# Patient Record
Sex: Female | Born: 1985 | Hispanic: Yes | Marital: Married | Smoking: Never smoker
Health system: Southern US, Community
[De-identification: ages and names within clinical notes are randomized; demographics above are authoritative.]

## PROBLEM LIST (undated history)

## (undated) ENCOUNTER — Inpatient Hospital Stay (HOSPITAL_COMMUNITY): Payer: Self-pay

## (undated) DIAGNOSIS — I10 Essential (primary) hypertension: Secondary | ICD-10-CM

## (undated) DIAGNOSIS — A749 Chlamydial infection, unspecified: Secondary | ICD-10-CM

## (undated) DIAGNOSIS — R87619 Unspecified abnormal cytological findings in specimens from cervix uteri: Secondary | ICD-10-CM

## (undated) DIAGNOSIS — IMO0002 Reserved for concepts with insufficient information to code with codable children: Secondary | ICD-10-CM

## (undated) DIAGNOSIS — O139 Gestational [pregnancy-induced] hypertension without significant proteinuria, unspecified trimester: Secondary | ICD-10-CM

## (undated) HISTORY — DX: Essential (primary) hypertension: I10

---
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
## 2005-01-25 ENCOUNTER — Ambulatory Visit: Payer: Self-pay | Admitting: Internal Medicine

## 2009-12-12 DIAGNOSIS — A749 Chlamydial infection, unspecified: Secondary | ICD-10-CM

## 2009-12-12 HISTORY — DX: Chlamydial infection, unspecified: A74.9

## 2013-04-16 ENCOUNTER — Emergency Department (HOSPITAL_COMMUNITY)
Admission: EM | Admit: 2013-04-16 | Discharge: 2013-04-16 | Disposition: A | Discharge status: Home / Self Care | Payer: Self-pay | Attending: Emergency Medicine | Admitting: Emergency Medicine

## 2013-04-16 ENCOUNTER — Emergency Department (HOSPITAL_COMMUNITY): Payer: Self-pay

## 2013-04-16 ENCOUNTER — Encounter (HOSPITAL_COMMUNITY): Payer: Self-pay

## 2013-04-16 DIAGNOSIS — O208 Other hemorrhage in early pregnancy: Secondary | ICD-10-CM | POA: Insufficient documentation

## 2013-04-16 DIAGNOSIS — O209 Hemorrhage in early pregnancy, unspecified: Secondary | ICD-10-CM

## 2013-04-16 LAB — BASIC METABOLIC PANEL
BUN: 8 mg/dL (ref 6–23)
GFR calc Af Amer: >90 mL/min (ref >90)
GFR calc non Af Amer: >90 mL/min (ref >90)
Potassium: 4.1 mEq/L (ref 3.5–5.1)
Sodium: 135 mEq/L (ref 135–145)

## 2013-04-16 LAB — CBC WITH DIFFERENTIAL/PLATELET
Basophils Absolute: 0 10*3/uL (ref 0.0–0.1)
Basophils Relative: 0 % (ref 0–1)
Eosinophils Absolute: 0.2 10*3/uL (ref 0.0–0.7)
MCH: 30.3 pg (ref 26.0–34.0)
MCHC: 35.3 g/dL (ref 30.0–36.0)
Neutrophils Relative %: 64 % (ref 43–77)
Platelets: 279 10*3/uL (ref 150–400)
RDW: 13.2 % (ref 11.5–15.5)

## 2013-04-16 LAB — ABO/RH: ABO/RH(D): O POS

## 2013-04-16 LAB — WET PREP, GENITAL
Clue Cells Wet Prep HPF POC: NONE SEEN
Trich, Wet Prep: NONE SEEN
Yeast Wet Prep HPF POC: NONE SEEN

## 2013-04-16 LAB — HCG, QUANTITATIVE, PREGNANCY: hCG, Beta Chain, Quant, S: 38081 m[IU]/mL — ABNORMAL HIGH (ref <5)

## 2013-04-16 MED ORDER — PRENATAL COMPLETE 14-0.4 MG PO TABS: 1.0000 | ORAL_TABLET | Freq: Every day | ORAL | Status: DC

## 2013-04-16 NOTE — ED Notes (Signed)
Was told 2 weeks ago at the [facility] that she is approximately [redacted] weeks pregnant.  She denies any pain or discomfort but mild spotting this am.  She denies any n/v . She reports that the spotting has stopped

## 2013-04-16 NOTE — ED Provider Notes (Signed)
History     CSN: [id number]  Arrival date & time 04/16/13  0715   First MD Initiated Contact with Patient 04/16/13 [phone]      Chief Complaint  Patient presents with  . Vaginal Bleeding    (Consider location/radiation/quality/duration/timing/severity/associated sxs/prior treatment) HPI Comments: Patient is a 27 year old G4P3 female who presents with 1 episode of vaginal bleeding this morning. Patient reports having some spotting when she went to the bathroom this morning. She has not had an episode since then. She reports being about [redacted] weeks pregnant. She denies any other symptoms. No aggravating/alleviating factors. Patient denies any complications with previous pregnancies.   Patient is a 27 y.o. female presenting with vaginal bleeding.  Vaginal Bleeding    History reviewed. No pertinent past medical history.  History reviewed. No pertinent past surgical history.  No family history on file.  History  Substance Use Topics  . Smoking status: Never Smoker   . Smokeless tobacco: Not on file  . Alcohol Use: No    OB History   Grav Para Term Preterm Abortions TAB SAB Ect Mult Living   1               Review of Systems  Genitourinary: Positive for vaginal bleeding.  All other systems reviewed and are negative.    Allergies  Penicillins  Home Medications   Current Outpatient Rx  Name  Route  Sig  Dispense  Refill  . Prenatal Vit-Fe Fumarate-FA (PRENATAL MULTIVITAMIN) TABS   Oral   Take 1 tablet by mouth daily at 12 noon.           BP 129/83  Pulse 79  Temp(Src) 98.3 F (36.8 C) (Oral)  Resp 18  SpO2 100%  LMP 02/27/2013  Physical Exam  Nursing note and vitals reviewed. Constitutional: She appears well-developed and well-nourished. No distress.  HENT:  Head: Normocephalic and atraumatic.  Eyes: Conjunctivae are normal.  Neck: Normal range of motion.  Cardiovascular: Normal rate and regular rhythm.  Exam reveals no gallop and no friction rub.   No  murmur heard. Pulmonary/Chest: Effort normal and breath sounds normal. She has no wheezes. She has no rales. She exhibits no tenderness.  Abdominal: Soft. She exhibits no distension. There is no tenderness. There is no rebound and no guarding.  Genitourinary:  Cervical os close. No CMT. No adnexal tenderness to palpation or abnormal masses palpated.  Musculoskeletal: Normal range of motion.  Neurological: She is alert.  Speech is goal-oriented. Moves limbs without ataxia.   Skin: Skin is warm and dry.  Psychiatric: She has a normal mood and affect. Her behavior is normal.    ED Course  Procedures (including critical care time)  Labs Reviewed  WET PREP, GENITAL - Abnormal; Notable for the following:    WBC, Wet Prep HPF POC FEW (*)    All other components within normal limits  CBC WITH DIFFERENTIAL - Abnormal; Notable for the following:    HCT 34.0 (*)    All other components within normal limits  HCG, QUANTITATIVE, PREGNANCY - Abnormal; Notable for the following:    hCG, Beta Chain, Quant, [place] [address] (*)    All other components within normal limits  GC/CHLAMYDIA PROBE AMP  BASIC METABOLIC PANEL  ABO/RH   US Ob Comp Less 14 Wks  04/16/2013  *RADIOLOGY REPORT*  Clinical Data: 27 year old pregnant female with pelvic pain and vaginal bleeding.  Estimated gestational age of [redacted] weeks 4 days by LMP.  OBSTETRIC <14 WK  [place] AND TRANSVAGINAL OB US  Technique:  Both transabdominal and transvaginal ultrasound examinations were performed for complete evaluation of the gestation as well as the maternal uterus, adnexal regions, and pelvic cul-de-sac.  Transvaginal technique was performed to assess early pregnancy.  Comparison:  None  Intrauterine gestational sac:  Visualized/normal in shape. Yolk sac: Visualized Embryo: Visualized Cardiac Activity: Present Heart Rate: 133 bpm  CRL: 5.2  mm  6 w  2 d        [place] EDC: 12/08/2013  Maternal uterus/adnexae: A small subchorionic hemorrhage is identified. A 7 x 10 mm  hypoechoic area in the lower uterine body may represent a fibroid. The ovaries bilaterally are unremarkable.  There is a trace amount of free pelvic fluid.  There is no evidence of solid adnexal mass.  IMPRESSION: Single living intrauterine gestation with estimated gestational age of [redacted] weeks 2 days by this ultrasound.  Small subchorionic hemorrhage.  Question 7 x 10 mm lower uterine body fibroid.   Original Report Authenticated By: Harmon Pier, M.D.    US Ob Transvaginal  04/16/2013  *RADIOLOGY REPORT*  Clinical Data: 27 year old pregnant female with pelvic pain and vaginal bleeding.  Estimated gestational age of [redacted] weeks 4 days by LMP.  OBSTETRIC <14 WK [place] AND TRANSVAGINAL OB US  Technique:  Both transabdominal and transvaginal ultrasound examinations were performed for complete evaluation of the gestation as well as the maternal uterus, adnexal regions, and pelvic cul-de-sac.  Transvaginal technique was performed to assess early pregnancy.  Comparison:  None  Intrauterine gestational sac:  Visualized/normal in shape. Yolk sac: Visualized Embryo: Visualized Cardiac Activity: Present Heart Rate: 133 bpm  CRL: 5.2  mm  6 w  2 d        [place] EDC: 12/08/2013  Maternal uterus/adnexae: A small subchorionic hemorrhage is identified. A 7 x 10 mm hypoechoic area in the lower uterine body may represent a fibroid. The ovaries bilaterally are unremarkable.  There is a trace amount of free pelvic fluid.  There is no evidence of solid adnexal mass.  IMPRESSION: Single living intrauterine gestation with estimated gestational age of [redacted] weeks 2 days by this ultrasound.  Small subchorionic hemorrhage.  Question 7 x 10 mm lower uterine body fibroid.   Original Report Authenticated By: Harmon Pier, M.D.      1. Vaginal bleeding in pregnancy, first trimester       MDM  8:51 AM Labs, wet prep, and ultrasound pending. Patient afebrile with stable vitals at this time.   12:04 PM Labs, wet prep and US unremarkable for acute  changes. Patient is O positive blood type so she will not need Rhogam. Patient will be discharged with instructions to follow up with her OBGYN. I will discharge the patient with prenatal vitamins and instructions to follow up at [facility] with worsening or concerning symptoms.      Emilia Beck, PA-C 04/16/13 1500

## 2013-04-16 NOTE — ED Notes (Signed)
Pt. Has had no active bleeding since arrival.  Pt. Denies any pain or discomfort.   Explained to pt. That we are waiting for lab results.    Water given to pt.

## 2013-04-16 NOTE — ED Provider Notes (Signed)
Medical screening examination/treatment/procedure(s) were performed by non-physician practitioner and as supervising physician I was immediately available for consultation/collaboration.   Loren Racer, MD 04/16/13 1515

## 2013-04-17 LAB — GC/CHLAMYDIA PROBE AMP
CT Probe RNA: NEGATIVE
GC Probe RNA: NEGATIVE

## 2013-04-29 LAB — OB RESULTS CONSOLE RPR: RPR: NONREACTIVE

## 2013-04-29 LAB — OB RESULTS CONSOLE GC/CHLAMYDIA
Chlamydia: NEGATIVE
Gonorrhea: NEGATIVE

## 2013-04-29 LAB — OB RESULTS CONSOLE HIV ANTIBODY (ROUTINE TESTING): HIV: NONREACTIVE

## 2013-04-29 LAB — OB RESULTS CONSOLE HEPATITIS B SURFACE ANTIGEN: Hepatitis B Surface Ag: NEGATIVE

## 2013-04-29 LAB — OB RESULTS CONSOLE RUBELLA ANTIBODY, IGM: Rubella: IMMUNE

## 2013-09-11 DIAGNOSIS — O139 Gestational [pregnancy-induced] hypertension without significant proteinuria, unspecified trimester: Secondary | ICD-10-CM

## 2013-09-11 DIAGNOSIS — I10 Essential (primary) hypertension: Secondary | ICD-10-CM

## 2013-09-11 HISTORY — DX: Gestational (pregnancy-induced) hypertension without significant proteinuria, unspecified trimester: O13.9

## 2013-09-11 HISTORY — DX: Essential (primary) hypertension: I10

## 2013-09-12 LAB — OB RESULTS CONSOLE RPR: RPR: NONREACTIVE

## 2013-10-21 ENCOUNTER — Inpatient Hospital Stay (HOSPITAL_COMMUNITY)
Admission: AD | Admit: 2013-10-21 | Discharge: 2013-10-27 | DRG: 765 | Disposition: A | Discharge status: Home / Self Care | Payer: Medicaid Other | Source: Ambulatory Visit | Attending: Obstetrics & Gynecology | Admitting: Obstetrics & Gynecology

## 2013-10-21 ENCOUNTER — Inpatient Hospital Stay (HOSPITAL_COMMUNITY): Payer: Medicaid Other

## 2013-10-21 ENCOUNTER — Encounter (HOSPITAL_COMMUNITY): Payer: Self-pay | Admitting: *Deleted

## 2013-10-21 DIAGNOSIS — O133 Gestational [pregnancy-induced] hypertension without significant proteinuria, third trimester: Secondary | ICD-10-CM

## 2013-10-21 DIAGNOSIS — O1413 Severe pre-eclampsia, third trimester: Secondary | ICD-10-CM

## 2013-10-21 DIAGNOSIS — O1414 Severe pre-eclampsia complicating childbirth: Principal | ICD-10-CM | POA: Diagnosis present

## 2013-10-21 DIAGNOSIS — O1410 Severe pre-eclampsia, unspecified trimester: Secondary | ICD-10-CM | POA: Diagnosis present

## 2013-10-21 DIAGNOSIS — O1490 Unspecified pre-eclampsia, unspecified trimester: Secondary | ICD-10-CM

## 2013-10-21 DIAGNOSIS — O4100X0 Oligohydramnios, unspecified trimester, not applicable or unspecified: Secondary | ICD-10-CM | POA: Diagnosis present

## 2013-10-21 HISTORY — DX: Reserved for concepts with insufficient information to code with codable children: IMO0002

## 2013-10-21 HISTORY — DX: Chlamydial infection, unspecified: A74.9

## 2013-10-21 HISTORY — DX: Unspecified abnormal cytological findings in specimens from cervix uteri: R87.619

## 2013-10-21 HISTORY — DX: Gestational (pregnancy-induced) hypertension without significant proteinuria, unspecified trimester: O13.9

## 2013-10-21 LAB — COMPREHENSIVE METABOLIC PANEL
Albumin: 2.5 g/dL — ABNORMAL LOW (ref 3.5–5.2)
Alkaline Phosphatase: 172 U/L — ABNORMAL HIGH (ref 39–117)
BUN: 8 mg/dL (ref 6–23)
Potassium: 4 mEq/L (ref 3.5–5.1)
Total Protein: 6.3 g/dL (ref 6.0–8.3)

## 2013-10-21 LAB — PROTEIN / CREATININE RATIO, URINE
Creatinine, Urine: 40.4 mg/dL
Total Protein, Urine: 59.3 mg/dL

## 2013-10-21 LAB — TYPE AND SCREEN: ABO/RH(D): O POS

## 2013-10-21 LAB — CBC
HCT: 34.6 % — ABNORMAL LOW (ref 36.0–46.0)
MCHC: 34.4 g/dL (ref 30.0–36.0)
RDW: 14.2 % (ref 11.5–15.5)

## 2013-10-21 MED ORDER — BETAMETHASONE SOD PHOS & ACET 6 (3-3) MG/ML IJ SUSP
12.0000 mg | INTRAMUSCULAR | Status: AC
  Administered 2013-10-22: 12 mg via INTRAMUSCULAR
  Filled 2013-10-21: qty 2

## 2013-10-21 MED ORDER — CALCIUM CARBONATE ANTACID 500 MG PO CHEW: 2.0000 | CHEWABLE_TABLET | ORAL | Status: DC | PRN

## 2013-10-21 MED ORDER — BETAMETHASONE SOD PHOS & ACET 6 (3-3) MG/ML IJ SUSP
12.0000 mg | Freq: Once | INTRAMUSCULAR | Status: AC
  Administered 2013-10-21: 12 mg via INTRAMUSCULAR
  Filled 2013-10-21: qty 2

## 2013-10-21 MED ORDER — ZOLPIDEM TARTRATE 5 MG PO TABS: 5.0000 mg | ORAL_TABLET | Freq: Every evening | ORAL | Status: DC | PRN

## 2013-10-21 MED ORDER — SODIUM CHLORIDE 0.9 % IJ SOLN
3.0000 mL | Freq: Two times a day (BID) | INTRAMUSCULAR | Status: DC
  Administered 2013-10-21 – 2013-10-22 (×2): 3 mL via INTRAVENOUS

## 2013-10-21 MED ORDER — PRENATAL MULTIVITAMIN CH
1.0000 | ORAL_TABLET | Freq: Every day | ORAL | Status: DC
  Administered 2013-10-22 – 2013-10-23 (×2): 1 via ORAL
  Filled 2013-10-21 (×2): qty 1

## 2013-10-21 MED ORDER — ACETAMINOPHEN 325 MG PO TABS
650.0000 mg | ORAL_TABLET | ORAL | Status: DC | PRN
  Administered 2013-10-21 – 2013-10-22 (×3): 650 mg via ORAL
  Filled 2013-10-21 (×3): qty 2

## 2013-10-21 MED ORDER — DOCUSATE SODIUM 100 MG PO CAPS: 100.0000 mg | ORAL_CAPSULE | Freq: Every day | ORAL | Status: DC | PRN

## 2013-10-21 NOTE — H&P (Signed)
FACULTY PRACTICE ANTEPARTUM ADMISSION HISTORY AND PHYSICAL  Chief Complaint:  Hypertension   Melissa Quinn is a 27 y.o.  [phone] with IUP at [redacted]w[redacted]d by LMP presenting for Hypertension  Pt states she was seen at health department on Friday at which time her blood pressure was elevated (140s systolic per pt report). She was instructed to complete a 24 hour urine study and follow up today. Today at the HD her BP remained elevated and she was sent to MAU for further evaluation. Her initial BP in MAU was 159/100 but has since trended down to 136/91. She denies headache, vision changes, RUQ pain and lower extremity swelling. She endorses mild swelling of her hands. She reports a normal prenatal course thus far and states she has never had problems with her BP in the past.   She reports occasional contractions (last felt some over the weekend). She denies LOF and VB. She reports normal FM.   Menstrual History: OB History   Grav Para Term Preterm Abortions TAB SAB Ect Mult Living   4 3 2 1      3       No LMP recorded.      Past Medical History  Diagnosis Date  . Pregnancy induced hypertension     Past Surgical History  Procedure Laterality Date  . No past surgeries      Family History  Problem Relation Age of Onset  . Diabetes Mother   . Hypertension Mother   . Hypertension Sister   . Asthma Son   . Diabetes Maternal Grandmother     History  Substance Use Topics  . Smoking status: Never Smoker   . Smokeless tobacco: Never Used  . Alcohol Use: No      Allergies  Allergen Reactions  . Penicillins Hives    Prescriptions prior to admission  Medication Sig Dispense Refill  . Prenatal Vit-Fe Fumarate-FA (PRENATAL COMPLETE) 14-0.4 MG TABS Take 1 tablet by mouth daily.  60 each  3  . Prenatal Vit-Fe Fumarate-FA (PRENATAL MULTIVITAMIN) TABS Take 1 tablet by mouth daily at 12 noon.        Review of Systems - Negative except for what is mentioned in HPI.  Physical Exam   Blood pressure 136/91, pulse 74, temperature 98.3 F (36.8 C), temperature source Oral, resp. rate 18, height 4\' 9"  (1.448 m), weight 66.951 kg (147 lb 9.6 oz), unknown if currently breastfeeding. GENERAL: Well-developed, well-nourished female in no acute distress.  LUNGS: Clear to auscultation bilaterally.  HEART: Regular rate and rhythm. ABDOMEN: Soft, nontender, nondistended, gravid.  EXTREMITIES: Nontender, no edema, 2+ distal pulses. Presentation: cephalic by ultrasound FHT:  Baseline rate 145 bpm   Variability moderate  Accelerations present   Decelerations none Contractions: Every 5 mins   Labs: Results for orders placed during the hospital encounter of 10/21/13 (from the past 24 hour(s))  PROTEIN / CREATININE RATIO, URINE   Collection Time    10/21/13 10:40 AM      Result Value Range   Creatinine, Urine 40.40     Total Protein, Urine 59.3     PROTEIN CREATININE RATIO 1.47 (*) 0.00 - 0.15  CBC   Collection Time    10/21/13 10:53 AM      Result Value Range   WBC 7.3  4.0 - 10.5 K/uL   RBC 3.93  3.87 - 5.11 MIL/uL   Hemoglobin 11.9 (*) 12.0 - 15.0 g/dL   HCT 95.6 (*) 21.3 - 08.6 %   MCV  88.0  78.0 - 100.0 fL   MCH 30.3  26.0 - 34.0 pg   MCHC 34.4  30.0 - 36.0 g/dL   RDW 16.1  09.6 - 04.5 %   Platelets 208  150 - 400 K/uL  COMPREHENSIVE METABOLIC PANEL   Collection Time    10/21/13 10:53 AM      Result Value Range   Sodium 136  135 - 145 mEq/L   Potassium 4.0  3.5 - 5.1 mEq/L   Chloride 103  96 - 112 mEq/L   CO2 23  19 - 32 mEq/L   Glucose, Bld 78  70 - 99 mg/dL   BUN 8  6 - 23 mg/dL   Creatinine, Ser 4.09  0.50 - 1.10 mg/dL   Calcium 9.3  8.4 - 81.1 mg/dL   Total Protein 6.3  6.0 - 8.3 g/dL   Albumin 2.5 (*) 3.5 - 5.2 g/dL   AST 21  0 - 37 U/L   ALT 15  0 - 35 U/L   Alkaline Phosphatase 172 (*) 39 - 117 U/L   Total Bilirubin 0.2 (*) 0.3 - 1.2 mg/dL   GFR calc non Af Amer >90  >90 mL/min   GFR calc Af Amer >90  >90 mL/min    Imaging Studies:  [place] BPP  8/8 AFI 15  Assessment: Melissa Quinn is  27 y.o. [id number] at [redacted]w[redacted]d by LMP presents with Hypertension. BP initially 140s-150s/79-100 however have now improved to 120s-130s/70s-90s. She is asymptomatic. CBC and CMP are unremarkable. BPP 8/8. AFI 15. Urine protein creatinine ratio elevated at 1.47 consistent with diagnosis of Preeclampsia without severe features.   Plan: 1. Admit to antepartum service for observation and BP monitoring.  2. She is s/p Betamethasone #1 today. Will give 2nd dose tomorrow at 1500.  3. FWB: Cat I. NST reactive. BPP 8/8.    Cowart, Ryann 11/10/201411:11 AM  I have seen and examined this patient and agree with above documentation in the resident's note. Pt with elevated pressures and with prot/cr ratio consistent with Pre-eclampsia. No severe features on labs or symptoms.  Admit for BMZ #2 tomorrow at 1500. Will then monitor overnight and ensure BP remains stable.  Plan discussed with Dr. Marice Potter.    Rulon Abide, M.D. [facility] Fellow 10/21/2013 4:50 PM

## 2013-10-21 NOTE — MAU Note (Signed)
FHR 155; NST reactive; EFM d/c'ed per provider after reviewing tracing; awaiting U/S.

## 2013-10-21 NOTE — MAU Note (Signed)
Pt sent from [facility], BP was high last week, is higher today, brought in 24 hour urine specimen.  Denies pain, LOF or bleeding.

## 2013-10-21 NOTE — MAU Note (Signed)
Patient presents with complaint that she went to [facility] Dept where her BP was elevated and has been since Friday so she was instructed to come to MAU. Patient brought a 24 hour specimen with her that she was instructed to start on Sunday and completed with morning. Specimen placed on ice.

## 2013-10-22 ENCOUNTER — Observation Stay (HOSPITAL_COMMUNITY): Payer: Medicaid Other

## 2013-10-22 ENCOUNTER — Encounter (HOSPITAL_COMMUNITY): Payer: Medicaid Other

## 2013-10-22 DIAGNOSIS — O1410 Severe pre-eclampsia, unspecified trimester: Secondary | ICD-10-CM | POA: Diagnosis present

## 2013-10-22 DIAGNOSIS — O139 Gestational [pregnancy-induced] hypertension without significant proteinuria, unspecified trimester: Secondary | ICD-10-CM

## 2013-10-22 LAB — COMPREHENSIVE METABOLIC PANEL
ALT: 16 U/L (ref 0–35)
Albumin: 2.5 g/dL — ABNORMAL LOW (ref 3.5–5.2)
Alkaline Phosphatase: 178 U/L — ABNORMAL HIGH (ref 39–117)
BUN: 11 mg/dL (ref 6–23)
CO2: 22 mEq/L (ref 19–32)
Chloride: 103 mEq/L (ref 96–112)
GFR calc non Af Amer: >90 mL/min (ref >90)
Glucose, Bld: 102 mg/dL — ABNORMAL HIGH (ref 70–99)
Potassium: 4.3 mEq/L (ref 3.5–5.1)
Sodium: 137 mEq/L (ref 135–145)
Total Bilirubin: 0.2 mg/dL — ABNORMAL LOW (ref 0.3–1.2)

## 2013-10-22 LAB — ABO/RH: ABO/RH(D): O POS

## 2013-10-22 LAB — CBC
HCT: 35.7 % — ABNORMAL LOW (ref 36.0–46.0)
Hemoglobin: 12.2 g/dL (ref 12.0–15.0)
MCV: 88.6 fL (ref 78.0–100.0)
RBC: 4.03 MIL/uL (ref 3.87–5.11)
RDW: 14.3 % (ref 11.5–15.5)
WBC: 10.4 10*3/uL (ref 4.0–10.5)

## 2013-10-22 LAB — PROTEIN, URINE, 24 HOUR
Collection Interval-UPROT: 24 hours
Urine Total Volume-UPROT: 1020 mL

## 2013-10-22 LAB — OB RESULTS CONSOLE GBS: GBS: NEGATIVE

## 2013-10-22 MED ORDER — MAGNESIUM SULFATE 40 G IN LACTATED RINGERS - SIMPLE
2.0000 g/h | INTRAVENOUS | Status: DC
  Administered 2013-10-22 – 2013-10-23 (×2): 2 g/h via INTRAVENOUS
  Filled 2013-10-22 (×2): qty 500

## 2013-10-22 MED ORDER — OXYCODONE-ACETAMINOPHEN 5-325 MG PO TABS
1.0000 | ORAL_TABLET | Freq: Four times a day (QID) | ORAL | Status: DC | PRN
  Administered 2013-10-22: 2 via ORAL
  Filled 2013-10-22: qty 2

## 2013-10-22 MED ORDER — FENTANYL CITRATE 0.05 MG/ML IJ SOLN
50.0000 ug | INTRAMUSCULAR | Status: DC | PRN
  Administered 2013-10-22 – 2013-10-23 (×6): 50 ug via INTRAVENOUS
  Administered 2013-10-23: 100 ug via INTRAVENOUS
  Filled 2013-10-22 (×8): qty 2

## 2013-10-22 MED ORDER — LACTATED RINGERS IV SOLN
INTRAVENOUS | Status: DC
  Administered 2013-10-22 – 2013-10-23 (×4): via INTRAVENOUS

## 2013-10-22 MED ORDER — MAGNESIUM SULFATE BOLUS VIA INFUSION
4.0000 g | Freq: Once | INTRAVENOUS | Status: AC
  Administered 2013-10-22: 4 g via INTRAVENOUS
  Filled 2013-10-22: qty 500

## 2013-10-22 MED ORDER — HYDRALAZINE HCL 20 MG/ML IJ SOLN
10.0000 mg | INTRAMUSCULAR | Status: DC | PRN
  Administered 2013-10-23: 10 mg via INTRAVENOUS
  Filled 2013-10-22: qty 1

## 2013-10-22 NOTE — Consult Note (Signed)
Maternal Fetal Medicine Consultation  Requesting Provider(s): Jaynie Collins, MD  Reason for consultation: Preeclampsia  HPI: Melissa Quinn is a 27 yo [id number] currently at 23 2/7 weeks - admitted yesterday due to preeclampsia.  The patient has had labile blood pressures - mostly in the 140-150/90 range.  Recent 24-hr urine showed 622 mg /24 hr protein.  The remainder of the patient's preeclampsia labs have been within normal limits. The patient reports a frontal headache - was severe earlier in the day and resolved.  She reports that it recently returned (currently 2-3/10).  She is currently on Magnesium sulfate prophylaxis.  Her prenatal course has otherwise been uncomplicated.  She reports having normal blood pressures earlier in pregnancy and denies a history of chronic hypertension.  OB History: OB History   Grav Para Term Preterm Abortions TAB SAB Ect Mult Living   5 3 2 1      3     G1 - age 23, 55 week PROM, SVD G27 - age 27, term SVD without complications G15 - age 13, term SVD without complications G4 - current (different father)  PMH:  Past Medical History  Diagnosis Date  . Pregnancy induced hypertension     PSH:  Past Surgical History  Procedure Laterality Date  . No past surgeries     Meds:  Scheduled Meds: . prenatal multivitamin  1 tablet Oral Q1200  . sodium chloride  3 mL Intravenous Q12H   Continuous Infusions: . lactated ringers 100 mL/hr at 10/22/13 1113  . magnesium sulfate 2 g/hr (10/22/13 1134)   PRN Meds:.acetaminophen, calcium carbonate, docusate sodium, oxyCODONE-acetaminophen, zolpidem  Allergies:  Allergies  Allergen Reactions  . Penicillins Hives   FH: denies family history of birth defects or hereditary disorders  Soc: denies ETOH use of tobacco use during pregnancy  Review of Systems: no vaginal bleeding or cramping/contractions, no LOF, no nausea/vomiting. All other systems reviewed and are negative. PE:   Filed Vitals:   10/22/13 1504  BP: 136/88  Pulse: 89  Temp:   Resp: 18    GEN: well-appearing female ABD: gravid, NT Ext: 1+ edema, 2+ DTRs with single beat of clonus  Ultrasound: Single IUP at 32 2/7 weeks Preeclampsia Fetal growth is appropriate (55th %tile) Somewhat limited views of the fetal anatomy obtained due to late gestational age No gross anomalies noted Oligohydramnios noted (AFI 4.6 cm)  Labs: CBC    Component Value Date/Time   WBC 10.4 10/22/2013 0525   RBC 4.03 10/22/2013 0525   HGB 12.2 10/22/2013 0525   HCT 35.7* 10/22/2013 0525   PLT 228 10/22/2013 0525   MCV 88.6 10/22/2013 0525   MCH 30.3 10/22/2013 0525   MCHC 34.2 10/22/2013 0525   RDW 14.3 10/22/2013 0525   LYMPHSABS 2.5 04/16/2013 0812   MONOABS 0.6 04/16/2013 0812   EOSABS 0.2 04/16/2013 0812   BASOSABS 0.0 04/16/2013 0812   CMP     Component Value Date/Time   NA 137 10/22/2013 0525   K 4.3 10/22/2013 0525   CL 103 10/22/2013 0525   CO2 22 10/22/2013 0525   GLUCOSE 102* 10/22/2013 0525   BUN 11 10/22/2013 0525   CREATININE 0.61 10/22/2013 0525   CALCIUM 9.3 10/22/2013 0525   PROT 6.3 10/22/2013 0525   ALBUMIN 2.5* 10/22/2013 0525   AST 21 10/22/2013 0525   ALT 16 10/22/2013 0525   ALKPHOS 178* 10/22/2013 0525   BILITOT 0.2* 10/22/2013 0525   GFRNONAA >90 10/22/2013 0525   GFRAA >90  10/22/2013 0525   Protein/Creat = 1.47  24 hr urine protein - 622 mg/24 hrs  A/P: 1) Single IUP at 32 2/7 weeks         2) Preeclampsia - if the patient continues to have frontal headaches that do not resolve with treatment (may need to try a single course of narcotics), would recommend delivery (criteria for preeclampsia with severe features).  Recommend continuing Magnesium sulfate prophylaxis for the next 12-24 hours.  If possible would try to achieve another 12-24 hours to maximize benefit from course of betamethasone.  If the headaches resolves, she may be a candidate for close outpatient monitoring.  At present, this is  the only criteria that puts her in the severe category.  If this were to occur, would recommend 2x weekly NSTs with at least weekly AFIs and serial growth scans every 3-4 weeks and weekly preeclampsia labs.  Would move toward earlier delivery is she meets any other criteria for preeclampsia with severe features (LFTs > 2x normal, Creat > 1.1, Plts < 100K, etc.)         3) Oligohydramnios - if persistent, would recommend delivery at 36-37 weeks.  Findings and recommendations discussed with Dr. Macon Large  Thank you for the opportunity to be a part of the care of Melissa Quinn. Please contact our office if we can be of further assistance.   I spent approximately 30 minutes with this patient with over 50% of time spent in face-to-face counseling.  Alpha Gula, MD Maternal Fetal Medicine

## 2013-10-22 NOTE — Consult Note (Signed)
Asked by Dr Macon Large to speak to Melissa Quinn to discuss outcome of preterm infants. I reviewed her history. Her first pregnancy was a [redacted] wk gestation with 2 term babies that followed. Briefly, she is currently at 33 2/7 weeks, pregnancy was uncomplicated until recently with onset of preeclampsia and oligohydramnios. The baby is appropriately grown, EFW 2130 gms, 55%. Prenatal labs are neg with unknown GBS status. She has received her 2nd dose of betamethasone today and is now on magnesium sulfate for preeclampsia.    I spoke to Melissa Quinn with her partner present. She delivered her 32 week preterm here and that baby stayed with [place] in our NICU. He is now 27 y.o, is healthy and doing well in school. I reminded them of the common problems encountered at this gestation such as lung immaturity needing resp support. I discussed IVF, gavage feeding, temp support, and estimated LOS. Melissa Quinn does not appear worried as her previous baby has done well. She plans to breast feed as she did the other child.   Thank you for this consult.  I spent 27 min with this consult, over 50% of the time was spent  with face to face counseling.  Lucillie Garfinkel, MD Neonatologist

## 2013-10-22 NOTE — Progress Notes (Signed)
UR completed 

## 2013-10-22 NOTE — Progress Notes (Signed)
FACULTY PRACTICE ANTEPARTUM COMPREHENSIVE PROGRESS NOTE  Melissa Quinn is a 27 y.o. [phone] at [redacted]w[redacted]d  who is admitted for evalaution of preeclampsia.  Estimated Date of Delivery: 12/08/13 Fetal presentation is cephalic.  Length of Stay:  1 Days. 10/21/2013  Subjective: Patient reports severe frontal headache and described seeing "black spots" when she watches TV. Also reported mild epigastric pain earlier in the day, resolved with eating.   Patient reports good fetal movement.  She reports no uterine contractions, no bleeding and no loss of fluid per vagina.  Vitals:  Blood pressure 146/88, pulse 100, temperature 98.4 F (36.9 C), temperature source Oral, resp. rate 18, height 4\' 9"  (1.448 m), weight 147 lb 9.6 oz (66.951 kg), last menstrual period 02/27/2013, unknown if currently breastfeeding. Physical Examination: General - alert, oriented to person, place, and time Chest - clear to auscultation, no wheezes, rales or rhonchi, symmetric air entry Heart - normal rate, regular rhythm, normal S1, S2, no murmurs, rubs, clicks or gallops, normal rate and regular rhythm Abdomen - soft, nontender, nondistended, gravid, no masses or organomegaly Cervical Exam: not evaluated Extremities: extremities normal, atraumatic, no cyanosis or edema and Homans sign is negative, no sign of DVT with DTRs 3+ bilaterally with 1 beat of clonus Membranes:intact  Fetal Monitoring:  Baseline: 150 bpm, Variability: moderate, Accelerations: Reactive and Decelerations: Absent  Results for orders placed during the hospital encounter of 10/21/13 (from the past 24 hour(s))  CBC   Collection Time    10/21/13 10:53 AM      Result Value Range   WBC 7.3  4.0 - 10.5 K/uL   RBC 3.93  3.87 - 5.11 MIL/uL   Hemoglobin 11.9 (*) 12.0 - 15.0 g/dL   HCT 45.4 (*) 09.8 - 11.9 %   MCV 88.0  78.0 - 100.0 fL   MCH 30.3  26.0 - 34.0 pg   MCHC 34.4  30.0 - 36.0 g/dL   RDW 14.7  82.9 - 56.2 %   Platelets 208  150 - 400 K/uL   COMPREHENSIVE METABOLIC PANEL   Collection Time    10/21/13 10:53 AM      Result Value Range   Sodium 136  135 - 145 mEq/L   Potassium 4.0  3.5 - 5.1 mEq/L   Chloride 103  96 - 112 mEq/L   CO2 23  19 - 32 mEq/L   Glucose, Bld 78  70 - 99 mg/dL   BUN 8  6 - 23 mg/dL   Creatinine, Ser 1.30  0.50 - 1.10 mg/dL   Calcium 9.3  8.4 - 86.5 mg/dL   Total Protein 6.3  6.0 - 8.3 g/dL   Albumin 2.5 (*) 3.5 - 5.2 g/dL   AST 21  0 - 37 U/L   ALT 15  0 - 35 U/L   Alkaline Phosphatase 172 (*) 39 - 117 U/L   Total Bilirubin 0.2 (*) 0.3 - 1.2 mg/dL   GFR calc non Af Amer >90  >90 mL/min   GFR calc Af Amer >90  >90 mL/min  TYPE AND SCREEN   Collection Time    10/21/13  8:45 PM      Result Value Range   ABO/RH(D) O POS     Antibody Screen NEG     Sample Expiration 10/24/2013    ABO/RH   Collection Time    10/21/13  8:45 PM      Result Value Range   ABO/RH(D) O POS    CBC  Collection Time    10/22/13  5:25 AM      Result Value Range   WBC 10.4  4.0 - 10.5 K/uL   RBC 4.03  3.87 - 5.11 MIL/uL   Hemoglobin 12.2  12.0 - 15.0 g/dL   HCT 11.9 (*) 14.7 - 82.9 %   MCV 88.6  78.0 - 100.0 fL   MCH 30.3  26.0 - 34.0 pg   MCHC 34.2  30.0 - 36.0 g/dL   RDW 56.2  13.0 - 86.5 %   Platelets 228  150 - 400 K/uL  COMPREHENSIVE METABOLIC PANEL   Collection Time    10/22/13  5:25 AM      Result Value Range   Sodium 137  135 - 145 mEq/L   Potassium 4.3  3.5 - 5.1 mEq/L   Chloride 103  96 - 112 mEq/L   CO2 22  19 - 32 mEq/L   Glucose, Bld 102 (*) 70 - 99 mg/dL   BUN 11  6 - 23 mg/dL   Creatinine, Ser 7.84  0.50 - 1.10 mg/dL   Calcium 9.3  8.4 - 69.6 mg/dL   Total Protein 6.3  6.0 - 8.3 g/dL   Albumin 2.5 (*) 3.5 - 5.2 g/dL   AST 21  0 - 37 U/L   ALT 16  0 - 35 U/L   Alkaline Phosphatase 178 (*) 39 - 117 U/L   Total Bilirubin 0.2 (*) 0.3 - 1.2 mg/dL   GFR calc non Af Amer >90  >90 mL/min   GFR calc Af Amer >90  >90 mL/min    US Ob Limited  10/21/2013   OBSTETRICAL ULTRASOUND: This exam  was performed within a [facility]. The OB US report was generated in the AS system, and faxed to the ordering physician.   This report is also available in [employer] and in the [employer]. See AS Obstetric US report.  US Fetal Bpp W/o Non Stress  10/21/2013   OBSTETRICAL ULTRASOUND: This exam was performed within a [facility]. The OB US report was generated in the AS system, and faxed to the ordering physician.   This report is also available in [employer] and in the [employer]. See AS Obstetric US report.   . betamethasone acetate-betamethasone sodium phosphate  12 mg Intramuscular Q24H  . magnesium  4 g Intravenous Once  . prenatal multivitamin  1 tablet Oral Q1200  . sodium chloride  3 mL Intravenous Q12H   I have reviewed the patient's current medications.  ASSESSMENT: Patient Active Problem List   Diagnosis Date Noted  . Hypertension in pregnancy, preeclampsia, severe, antepartum 10/22/2013    PLAN: Patient has severe features based on headaches for now; scotomata and hyperreflexia are also very concerning.  Will start magnesium sulfate for eclampsia prophylaxis, continue until at least she is 48 hours after betamethasone which will be 1500 tomorrow 10/23/13.  Tylenol prn headache. Fetal monitoring is Category I now, continue as per magnesium sulfate protocol.  Continue close observation. MFM will be consulted, and NICU will also be consulted.   Jaynie Collins, MD, FACOG Attending Obstetrician & Gynecologist Faculty Practice, [facility] [place]

## 2013-10-22 NOTE — Progress Notes (Signed)
Faculty Practice OB/GYN Attending Note  Subjective:  Called to evaluate patient with SOB; she just received Fentanyl for her headaches.  Headache is better, she just feels very sleepy.  Has blurry vision but no current scotomata.  FHR reassuring, no contractions, no LOF or vaginal bleeding. Good FM.   Admitted on 10/21/2013 for Hypertension in pregnancy, preeclampsia, severe, antepartum.   Objective:  Blood pressure 145/99, pulse 104, temperature 98.3 F (36.8 C), temperature source Oral, resp. rate 18, height 4\' 9"  (1.448 m), weight 147 lb 9.6 oz (66.951 kg), last menstrual period 02/27/2013, SpO2 96.00%, unknown if currently breastfeeding. FHT  Baseline 130 bpm, moderate variability, +accelerations, no decelerations Toco: flat Gen: NAD; +facial edema Lungs: CTAB Abdomen: NT gravid fundus, soft Cervix: Deferred Ext: 3+ DTRs with 1 beat of clonus, no edema, no cyanosis, negative Homan's sign  Assessment & Plan:  27 y.o. [id number] at [redacted]w[redacted]d with severe preeclampsia  (headache).  Stable oxygen saturations 96-99% RA, lungs are clear and not worrisome for pulmonary edema.   Headache is improved.  Patient was reassured.  Will continue magnesium sulfate, hope to get to 48 hours after initial BMZ dose (1500 10/23/13), but will proceed with IOL if headache worsens or if there are other concerning severe features/markers of maternal-lfetal deterioration.  Appreciate the consults by Dr. Claudean Severance (MFM) and Dr. Mikle Bosworth (Neonatlogy).  Will continue close observation.  Jaynie Collins, MD, FACOG Attending Obstetrician & Gynecologist Faculty Practice, [facility]

## 2013-10-22 NOTE — Progress Notes (Signed)
Dr. Mikle Bosworth called for a Neo Consult

## 2013-10-22 NOTE — Progress Notes (Signed)
I received a referral for pt since her plan of care had changed.  She was feeling drowsy from her medication today and was not up for talking, but she was appreciative of the support.  I will attempt to visit her tomorrow.  [employer] Pager, [phone] 12:17 PM

## 2013-10-23 ENCOUNTER — Encounter (HOSPITAL_COMMUNITY): Payer: Self-pay | Admitting: *Deleted

## 2013-10-23 ENCOUNTER — Encounter (HOSPITAL_COMMUNITY): Payer: Medicaid Other | Admitting: Anesthesiology

## 2013-10-23 ENCOUNTER — Inpatient Hospital Stay (HOSPITAL_COMMUNITY): Payer: Medicaid Other | Admitting: Anesthesiology

## 2013-10-23 ENCOUNTER — Encounter (HOSPITAL_COMMUNITY)
Admission: AD | Disposition: A | Discharge status: Home / Self Care | Payer: Self-pay | Source: Ambulatory Visit | Attending: Obstetrics & Gynecology

## 2013-10-23 LAB — DIFFERENTIAL
Basophils Absolute: 0 10*3/uL (ref 0.0–0.1)
Basophils Relative: 0 % (ref 0–1)
Eosinophils Relative: 0 % (ref 0–5)
Lymphocytes Relative: 22 % (ref 12–46)

## 2013-10-23 LAB — COMPREHENSIVE METABOLIC PANEL
AST: 24 U/L (ref 0–37)
Albumin: 2.6 g/dL — ABNORMAL LOW (ref 3.5–5.2)
Albumin: 2.7 g/dL — ABNORMAL LOW (ref 3.5–5.2)
Alkaline Phosphatase: 167 U/L — ABNORMAL HIGH (ref 39–117)
Alkaline Phosphatase: 173 U/L — ABNORMAL HIGH (ref 39–117)
BUN: 8 mg/dL (ref 6–23)
BUN: 8 mg/dL (ref 6–23)
CO2: 24 mEq/L (ref 19–32)
Chloride: 98 mEq/L (ref 96–112)
Creatinine, Ser: 0.5 mg/dL (ref 0.50–1.10)
GFR calc non Af Amer: >90 mL/min (ref >90)
Potassium: 3.8 mEq/L (ref 3.5–5.1)
Potassium: 3.4 mEq/L — ABNORMAL LOW (ref 3.5–5.1)
Total Bilirubin: 0.2 mg/dL — ABNORMAL LOW (ref 0.3–1.2)
Total Bilirubin: 0.2 mg/dL — ABNORMAL LOW (ref 0.3–1.2)
Total Protein: 6.5 g/dL (ref 6.0–8.3)
Total Protein: 6.7 g/dL (ref 6.0–8.3)

## 2013-10-23 LAB — CBC
HCT: 34.5 % — ABNORMAL LOW (ref 36.0–46.0)
Hemoglobin: 12.2 g/dL (ref 12.0–15.0)
MCH: 30.2 pg (ref 26.0–34.0)
MCHC: 34.2 g/dL (ref 30.0–36.0)
MCV: 88.9 fL (ref 78.0–100.0)
MCV: 89.1 fL (ref 78.0–100.0)
Platelets: 227 10*3/uL (ref 150–400)
Platelets: 230 10*3/uL (ref 150–400)
RDW: 14.7 % (ref 11.5–15.5)
RDW: 14.7 % (ref 11.5–15.5)

## 2013-10-23 LAB — GROUP B STREP BY PCR: Group B strep by PCR: NEGATIVE

## 2013-10-23 SURGERY — Surgical Case
Anesthesia: Spinal | Site: Abdomen | Wound class: Clean Contaminated

## 2013-10-23 MED ORDER — CITRIC ACID-SODIUM CITRATE 334-500 MG/5ML PO SOLN
30.0000 mL | ORAL | Status: DC | PRN
  Administered 2013-10-23: 30 mL via ORAL
  Filled 2013-10-23: qty 15

## 2013-10-23 MED ORDER — MISOPROSTOL 25 MCG QUARTER TABLET
25.0000 ug | ORAL_TABLET | ORAL | Status: DC
  Administered 2013-10-23: 25 ug via VAGINAL
  Filled 2013-10-23 (×2): qty 1
  Filled 2013-10-23: qty 0.25

## 2013-10-23 MED ORDER — OXYTOCIN 40 UNITS IN LACTATED RINGERS INFUSION - SIMPLE MED: 62.5000 mL/h | INTRAVENOUS | Status: DC

## 2013-10-23 MED ORDER — PROPRANOLOL HCL 1 MG/ML IV SOLN
INTRAVENOUS | Status: AC
  Filled 2013-10-23: qty 1

## 2013-10-23 MED ORDER — LIDOCAINE HCL (PF) 1 % IJ SOLN: 30.0000 mL | INTRAMUSCULAR | Status: DC | PRN

## 2013-10-23 MED ORDER — OXYTOCIN 10 UNIT/ML IJ SOLN
INTRAMUSCULAR | Status: AC
  Filled 2013-10-23: qty 4

## 2013-10-23 MED ORDER — IBUPROFEN 600 MG PO TABS: 600.0000 mg | ORAL_TABLET | Freq: Four times a day (QID) | ORAL | Status: DC | PRN

## 2013-10-23 MED ORDER — MORPHINE SULFATE 0.5 MG/ML IJ SOLN
INTRAMUSCULAR | Status: AC
  Filled 2013-10-23: qty 10

## 2013-10-23 MED ORDER — LACTATED RINGERS IV SOLN
500.0000 mL | INTRAVENOUS | Status: DC | PRN
  Administered 2013-10-23: 300 mL via INTRAVENOUS

## 2013-10-23 MED ORDER — ACETAMINOPHEN 325 MG PO TABS: 650.0000 mg | ORAL_TABLET | ORAL | Status: DC | PRN

## 2013-10-23 MED ORDER — ONDANSETRON HCL 4 MG/2ML IJ SOLN: 4.0000 mg | Freq: Four times a day (QID) | INTRAMUSCULAR | Status: DC | PRN

## 2013-10-23 MED ORDER — OXYCODONE-ACETAMINOPHEN 5-325 MG PO TABS
2.0000 | ORAL_TABLET | ORAL | Status: DC | PRN
  Administered 2013-10-23: 2 via ORAL
  Filled 2013-10-23: qty 2

## 2013-10-23 MED ORDER — OXYTOCIN BOLUS FROM INFUSION: 500.0000 mL | INTRAVENOUS | Status: DC

## 2013-10-23 MED ORDER — FENTANYL CITRATE 0.05 MG/ML IJ SOLN
INTRAMUSCULAR | Status: AC
  Filled 2013-10-23: qty 2

## 2013-10-23 MED ORDER — ONDANSETRON HCL 4 MG/2ML IJ SOLN
INTRAMUSCULAR | Status: AC
  Filled 2013-10-23: qty 2

## 2013-10-23 MED ORDER — FLEET ENEMA 7-19 GM/118ML RE ENEM: 1.0000 | ENEMA | RECTAL | Status: DC | PRN

## 2013-10-23 MED ORDER — OXYCODONE-ACETAMINOPHEN 5-325 MG PO TABS: 1.0000 | ORAL_TABLET | ORAL | Status: DC | PRN

## 2013-10-23 MED ORDER — TERBUTALINE SULFATE 1 MG/ML IJ SOLN: 0.2500 mg | Freq: Once | INTRAMUSCULAR | Status: AC | PRN

## 2013-10-23 MED ORDER — LACTATED RINGERS IV SOLN
INTRAVENOUS | Status: DC
  Administered 2013-10-24: via INTRAVENOUS

## 2013-10-23 MED ORDER — PHENYLEPHRINE 40 MCG/ML (10ML) SYRINGE FOR IV PUSH (FOR BLOOD PRESSURE SUPPORT)
PREFILLED_SYRINGE | INTRAVENOUS | Status: AC
  Filled 2013-10-23: qty 5

## 2013-10-23 SURGICAL SUPPLY — 36 items
APL SKNCLS STERI-STRIP NONHPOA (GAUZE/BANDAGES/DRESSINGS) ×1
BENZOIN TINCTURE PRP APPL 2/3 (GAUZE/BANDAGES/DRESSINGS) ×1 IMPLANT
CLAMP CORD UMBIL (MISCELLANEOUS) IMPLANT
CLOTH BEACON ORANGE TIMEOUT ST (SAFETY) ×2 IMPLANT
DRAPE LG THREE QUARTER DISP (DRAPES) IMPLANT
DRSG OPSITE POSTOP 4X10 (GAUZE/BANDAGES/DRESSINGS) ×2 IMPLANT
DURAPREP 26ML APPLICATOR (WOUND CARE) ×2 IMPLANT
ELECT REM PT RETURN 9FT ADLT (ELECTROSURGICAL) ×2
ELECTRODE REM PT RTRN 9FT ADLT (ELECTROSURGICAL) ×1 IMPLANT
EXTRACTOR VACUUM KIWI (MISCELLANEOUS) IMPLANT
GLOVE BIO SURGEON ST LM GN SZ9 (GLOVE) ×2 IMPLANT
GLOVE BIOGEL PI IND STRL 9 (GLOVE) ×1 IMPLANT
GLOVE BIOGEL PI INDICATOR 9 (GLOVE) ×1
GOWN PREVENTION PLUS XLARGE (GOWN DISPOSABLE) ×2 IMPLANT
GOWN STRL REIN 3XL LVL4 (GOWN DISPOSABLE) ×2 IMPLANT
GOWN STRL REIN XL XLG (GOWN DISPOSABLE) IMPLANT
NDL HYPO 25X5/8 SAFETYGLIDE (NEEDLE) IMPLANT
NEEDLE HYPO 25X5/8 SAFETYGLIDE (NEEDLE) IMPLANT
NS IRRIG 1000ML POUR BTL (IV SOLUTION) ×2 IMPLANT
PACK C SECTION WH (CUSTOM PROCEDURE TRAY) ×2 IMPLANT
PAD ABD 7.5X8 STRL (GAUZE/BANDAGES/DRESSINGS) ×1 IMPLANT
PAD OB MATERNITY 4.3X12.25 (PERSONAL CARE ITEMS) ×2 IMPLANT
RETRACTOR WND ALEXIS 25 LRG (MISCELLANEOUS) IMPLANT
RTRCTR C-SECT PINK 25CM LRG (MISCELLANEOUS) IMPLANT
RTRCTR WOUND ALEXIS 25CM LRG (MISCELLANEOUS)
STRIP CLOSURE SKIN 1/2X4 (GAUZE/BANDAGES/DRESSINGS) ×1 IMPLANT
SUT CHROMIC 0 CTX 36 (SUTURE) ×4 IMPLANT
SUT VIC AB 0 CT1 27 (SUTURE) ×2
SUT VIC AB 0 CT1 27XBRD ANBCTR (SUTURE) ×1 IMPLANT
SUT VIC AB 2-0 CT1 27 (SUTURE) ×4
SUT VIC AB 2-0 CT1 TAPERPNT 27 (SUTURE) ×2 IMPLANT
SUT VIC AB 4-0 KS 27 (SUTURE) ×2 IMPLANT
SYR BULB IRRIGATION 50ML (SYRINGE) IMPLANT
TOWEL OR 17X24 6PK STRL BLUE (TOWEL DISPOSABLE) ×2 IMPLANT
TRAY FOLEY CATH 14FR (SET/KITS/TRAYS/PACK) ×2 IMPLANT
WATER STERILE IRR 1000ML POUR (IV SOLUTION) ×2 IMPLANT

## 2013-10-23 NOTE — Progress Notes (Signed)
10mg  Hydralazine IVP given for elevated BP

## 2013-10-23 NOTE — Progress Notes (Signed)
FACULTY PRACTICE ANTEPARTUM COMPREHENSIVE PROGRESS NOTE  Melissa Quinn is a 27 y.o. [phone] at [redacted]w[redacted]d  who was admitted for evalaution of preeclampsia, now has severe preeclampsia by headache criterion.  Estimated Date of Delivery: 12/08/13 Fetal presentation is cephalic.  Length of Stay:  2 Days. 10/21/2013  Subjective: Patient reports improved frontal headache, says it went away last night but is returning this morning .  Rates it a 4/10.  Only reports blurry vision, no black spots. No RUQ/epigastric pain Patient reports good fetal movement.  She reports no uterine contractions, no bleeding and no loss of fluid per vagina.  Vitals:  Blood pressure 141/93, pulse 81, temperature 98.2 F (36.8 C), temperature source Oral, resp. rate 16, height 4\' 9"  (1.448 m), weight 147 lb 9.6 oz (66.951 kg), last menstrual period 02/27/2013, SpO2 97.00%, unknown if currently breastfeeding. Physical Examination: General - alert, oriented to person, place, and time.  +Facial edema Chest - clear to auscultation, no wheezes, rales or rhonchi, symmetric air entry Heart - normal rate, regular rhythm, normal S1, S2, no murmurs, rubs, clicks or gallops, normal rate and regular rhythm Abdomen - soft, nontender, nondistended, gravid, no masses or organomegaly Cervical Exam: not evaluated Extremities: extremities normal, atraumatic, no cyanosis or edema and Homans sign is negative, no sign of DVT with DTRs 2+ bilaterally with no clonus Membranes:intact  Fetal Monitoring:  Baseline: 150 bpm, Variability: moderate, Accelerations: Reactive and Decelerations: Absent  Results for orders placed during the hospital encounter of 10/21/13 (from the past 24 hour(s))  GROUP B STREP BY PCR   Collection Time    10/22/13 10:30 PM      Result Value Range   Group B strep by PCR NEGATIVE  NEGATIVE  OB RESULTS CONSOLE GBS   Collection Time    10/22/13 10:30 PM      Result Value Range   GBS Negative    CBC   Collection  Time    10/23/13  5:37 AM      Result Value Range   WBC 11.3 (*) 4.0 - 10.5 K/uL   RBC 3.88  3.87 - 5.11 MIL/uL   Hemoglobin 11.8 (*) 12.0 - 15.0 g/dL   HCT 45.4 (*) 09.8 - 11.9 %   MCV 88.9  78.0 - 100.0 fL   MCH 30.4  26.0 - 34.0 pg   MCHC 34.2  30.0 - 36.0 g/dL   RDW 14.7  82.9 - 56.2 %   Platelets 227  150 - 400 K/uL  COMPREHENSIVE METABOLIC PANEL   Collection Time    10/23/13  5:37 AM      Result Value Range   Sodium 136  135 - 145 mEq/L   Potassium 3.8  3.5 - 5.1 mEq/L   Chloride 102  96 - 112 mEq/L   CO2 23  19 - 32 mEq/L   Glucose, Bld 117 (*) 70 - 99 mg/dL   BUN 8  6 - 23 mg/dL   Creatinine, Ser 1.30  0.50 - 1.10 mg/dL   Calcium 7.6 (*) 8.4 - 10.5 mg/dL   Total Protein 6.5  6.0 - 8.3 g/dL   Albumin 2.6 (*) 3.5 - 5.2 g/dL   AST 21  0 - 37 U/L   ALT 15  0 - 35 U/L   Alkaline Phosphatase 167 (*) 39 - 117 U/L   Total Bilirubin 0.2 (*) 0.3 - 1.2 mg/dL   GFR calc non Af Amer >90  >90 mL/min   GFR calc Af Amer >  90  >90 mL/min   10/22/2013   OBSTETRICAL ULTRASOUND: Single IUP at 32 2/7 weeks  Fetal growth is appropriate (55th %tile)  Somewhat limited views of the fetal anatomy obtained due to late gestational age  No gross anomalies noted  Oligohydramnios noted (AFI 4.6 cm)  Current Medications . prenatal multivitamin  1 tablet Oral Q1200  . sodium chloride  3 mL Intravenous Q12H   I have reviewed the patient's current medications.  ASSESSMENT: Patient Active Problem List   Diagnosis Date Noted  . Hypertension in pregnancy, preeclampsia, severe, antepartum 10/22/2013    PLAN: Continue start magnesium sulfate for eclampsia prophylaxis until at least she is 48 hours after betamethasone which will be 1500 today 10/23/13. After this point, delivery/IOL will be indicated for persistent or worsening headaches, or if there are other concerning severe features/markers of maternal-fetal deterioration. GBS negative. Fetal monitoring is Category I now, continue as per  magnesium sulfate protocol.   Continue close observation.   Jaynie Collins, MD, FACOG Attending Obstetrician & Gynecologist Faculty Practice, [facility] [place]

## 2013-10-23 NOTE — Progress Notes (Signed)
FACULTY PRACTICE ANTEPARTUM(COMPREHENSIVE) NOTE  Melissa Quinn is a 27 y.o. [phone] at [redacted]w[redacted]d by early ultrasound who is admitted for preeclampsia , that now meets severe criteria based on Headache and now she is beginning to complain of epigastric pain, she threw up dinner and has had continued epigastic pain since, Headache has required IV fentanyl, and pt received IV labetolol after lunch. Labs showed normal lft and good platelets over 200k at 5 am, will RECHECK PIH LABS NOW.Marland Kitchen   Fetal presentation is cephalic. Length of Stay:  2  Days  Subjective: PT NOW WITH EPIGASTRIC PAIN AND RECURRENT HEADACHE WITH VISION ABNORMALITIES. Patient reports the fetal movement as active. Patient reports uterine contraction  activity as none. Patient reports  vaginal bleeding as none. Patient describes fluid per vagina as None.  Vitals:  Blood pressure 163/96, pulse 108, temperature 98.7 F (37.1 C), temperature source Oral, resp. rate 18, height 4\' 9"  (1.448 m), weight 65.499 kg (144 lb 6.4 oz), last menstrual period 02/27/2013, SpO2 100.00%, unknown if currently breastfeeding. Physical Examination:  General appearance - anxious and in mild to moderate distress Heart - normal rate and regular rhythm Abdomen - soft, nontender, nondistended Fundal Height:  size equals dates Cervical Exam: Position: posterior and found to be 0.5 cm/ 25%/-2 and fetal presentation is cephalic. Extremities: extremities normal, atraumatic, no cyanosis or edema and Homans sign is negative, no sign of DVT with DTRs 2+ bilaterally Membranes:intact  Fetal Monitoring:  Baseline: 135 bpm  Labs:  Results for orders placed during the hospital encounter of 10/21/13 (from the past 24 hour(s))  GROUP B STREP BY PCR   Collection Time    10/22/13 10:30 PM      Result Value Range   Group B strep by PCR NEGATIVE  NEGATIVE  OB RESULTS CONSOLE GBS   Collection Time    10/22/13 10:30 PM      Result Value Range   GBS Negative     CBC   Collection Time    10/23/13  5:37 AM      Result Value Range   WBC 11.3 (*) 4.0 - 10.5 K/uL   RBC 3.88  3.87 - 5.11 MIL/uL   Hemoglobin 11.8 (*) 12.0 - 15.0 g/dL   HCT 45.4 (*) 09.8 - 11.9 %   MCV 88.9  78.0 - 100.0 fL   MCH 30.4  26.0 - 34.0 pg   MCHC 34.2  30.0 - 36.0 g/dL   RDW 14.7  82.9 - 56.2 %   Platelets 227  150 - 400 K/uL  COMPREHENSIVE METABOLIC PANEL   Collection Time    10/23/13  5:37 AM      Result Value Range   Sodium 136  135 - 145 mEq/L   Potassium 3.8  3.5 - 5.1 mEq/L   Chloride 102  96 - 112 mEq/L   CO2 23  19 - 32 mEq/L   Glucose, Bld 117 (*) 70 - 99 mg/dL   BUN 8  6 - 23 mg/dL   Creatinine, Ser 1.30  0.50 - 1.10 mg/dL   Calcium 7.6 (*) 8.4 - 10.5 mg/dL   Total Protein 6.5  6.0 - 8.3 g/dL   Albumin 2.6 (*) 3.5 - 5.2 g/dL   AST 21  0 - 37 U/L   ALT 15  0 - 35 U/L   Alkaline Phosphatase 167 (*) 39 - 117 U/L   Total Bilirubin 0.2 (*) 0.3 - 1.2 mg/dL   GFR calc non Af Amer >  90  >90 mL/min   GFR calc Af Amer >90  >90 mL/min    Imaging Studies:     Medications:  Scheduled . prenatal multivitamin  1 tablet Oral Q1200  . sodium chloride  3 mL Intravenous Q12H   I have reviewed the patient's current medications.  ASSESSMENT: Patient Active Problem List   Diagnosis Date Noted  . Hypertension in pregnancy, preeclampsia, severe, antepartum 10/22/2013  WORSENING HEADACHE, AND EPIGASTRIC DISCOMFORT,    PLAN: wILL TRANSFER TO L&D FOR INDUCTION OF LABOR, CONTINUE MAG SULFATE, RECHECK PIH LABS NOW AND Q 12 H. BEGIN WITH CYTOTEC X 1-2 TIMES Q 4H, THEN FOLEY BULB +PITOCIN.  Robertine Kipper V 10/23/2013,6:12 PM

## 2013-10-23 NOTE — Progress Notes (Signed)
Pt. Has all belongings with her. Report given to [place], Charity fundraiser. Pt. Transferred to room 168

## 2013-10-23 NOTE — Anesthesia Preprocedure Evaluation (Signed)
Anesthesia Evaluation  Patient identified by MRN, date of birth, ID band Patient awake    Reviewed: Allergy & Precautions, H&P , NPO status , Patient's Chart, lab work & pertinent test results  Airway Mallampati: II TM Distance: >3 FB Neck ROM: Full    Dental  (+) Dental Advisory Given   Pulmonary neg pulmonary ROS,          Cardiovascular hypertension, negative cardio ROS  Rhythm:Regular     Neuro/Psych negative neurological ROS  negative psych ROS   GI/Hepatic negative GI ROS, Neg liver ROS,   Endo/Other  negative endocrine ROS  Renal/GU negative Renal ROS     Musculoskeletal negative musculoskeletal ROS (+)   Abdominal   Peds  Hematology negative hematology ROS (+)   Anesthesia Other Findings   Reproductive/Obstetrics (+) Pregnancy                           Anesthesia Physical Anesthesia Plan  ASA: II and emergent  Anesthesia Plan: Spinal   Post-op Pain Management:    Induction:   Airway Management Planned:   Additional Equipment:   Intra-op Plan:   Post-operative Plan:   Informed Consent: I have reviewed the patients History and Physical, chart, labs and discussed the procedure including the risks, benefits and alternatives for the proposed anesthesia with the patient or authorized representative who has indicated his/her understanding and acceptance.     Plan Discussed with: CRNA  Anesthesia Plan Comments:         Anesthesia Quick Evaluation

## 2013-10-23 NOTE — Progress Notes (Signed)
Ur chart review completed.  

## 2013-10-23 NOTE — Progress Notes (Signed)
Melissa Quinn is a 27 y.o. [phone] at [redacted]w[redacted]d by ultrasound admitted for induction of labor due to Pre-eclamptic toxemia of pregnancy.., severe preeclampsia based on headache, progressive, with elevated Pr / Cr ratio 1.47, and epigastric pain this evening.  Since cytotec at 7 p the pt developed a few mild contractions, and has developed a steady pattern of subtle decels felt to be lates,  Initially improving with O2 , but now increasing , repetitive.  Cx remains minimally changed, at 2/40/-2 midposition, but fetal intolerance of labor prohibits further induction efforts.  Cesarean recommended, technique reviewed, will proceed to primary cesarean section.  Subjective:no  Bleeding or srom.   Objective: BP 139/81  Pulse 83  Temp(Src) 98.1 F (36.7 C) (Oral)  Resp 20  Ht 4\' 9"  (1.448 m)  Wt 65.499 kg (144 lb 6.4 oz)  BMI 31.24 kg/m2  SpO2 100%  LMP 02/27/2013  Breastfeeding? Unknown I/O last 3 completed shifts: In: 5194.2 [P.O.:1180; I.V.:4014.2] Out: 4550 [Urine:4550] Total I/O In: 535 [P.O.:160; I.V.:375] Out: 925 [Urine:925]  FHT:  FHR: 135 bpm, variability: minimal ,  accelerations:  Abscent,  decelerations:  Present lates UC:   irregular, every 6-8 minutes SVE:   Dilation: 1.5 Effacement (%): 50 Station: -3 Exam by:: L. Cresenzo, rN  Labs: Lab Results  Component Value Date   WBC 12.3* 10/23/2013   HGB 12.2 10/23/2013   HCT 36.0 10/23/2013   MCV 89.1 10/23/2013   PLT 230 10/23/2013    Assessment / Plan: fetal intolerance of labor, severe preeclampsia,   Labor: not tolerated by fetus Preeclampsia:  on magnesium sulfate and no signs or symptoms of toxicity Fetal Wellbeing:  Category II Pain Control:  Labor support without medications I/D:  n/a Anticipated MOD:  cesarean section, risks reviewed. consent obtained.  Jori Frerichs V 10/23/2013, 11:30 PM

## 2013-10-24 ENCOUNTER — Encounter (HOSPITAL_COMMUNITY): Payer: Self-pay | Admitting: *Deleted

## 2013-10-24 DIAGNOSIS — O4100X0 Oligohydramnios, unspecified trimester, not applicable or unspecified: Secondary | ICD-10-CM

## 2013-10-24 DIAGNOSIS — O1414 Severe pre-eclampsia complicating childbirth: Secondary | ICD-10-CM

## 2013-10-24 LAB — COMPREHENSIVE METABOLIC PANEL
AST: 22 U/L (ref 0–37)
Albumin: 2.2 g/dL — ABNORMAL LOW (ref 3.5–5.2)
BUN: 10 mg/dL (ref 6–23)
CO2: 26 mEq/L (ref 19–32)
Calcium: 7.2 mg/dL — ABNORMAL LOW (ref 8.4–10.5)
Chloride: 99 mEq/L (ref 96–112)
Creatinine, Ser: 0.56 mg/dL (ref 0.50–1.10)
GFR calc Af Amer: >90 mL/min (ref >90)
Sodium: 135 mEq/L (ref 135–145)
Total Bilirubin: 0.1 mg/dL — ABNORMAL LOW (ref 0.3–1.2)
Total Protein: 5.4 g/dL — ABNORMAL LOW (ref 6.0–8.3)

## 2013-10-24 LAB — CBC
Hemoglobin: 11 g/dL — ABNORMAL LOW (ref 12.0–15.0)
MCH: 31.4 pg (ref 26.0–34.0)
MCV: 89.4 fL (ref 78.0–100.0)
Platelets: 209 10*3/uL (ref 150–400)
RDW: 14.7 % (ref 11.5–15.5)
WBC: 13.9 10*3/uL — ABNORMAL HIGH (ref 4.0–10.5)

## 2013-10-24 LAB — RPR: RPR Ser Ql: NONREACTIVE

## 2013-10-24 MED ORDER — NALOXONE HCL 0.4 MG/ML IJ SOLN: 0.4000 mg | INTRAMUSCULAR | Status: DC | PRN

## 2013-10-24 MED ORDER — ONDANSETRON HCL 4 MG PO TABS: 4.0000 mg | ORAL_TABLET | ORAL | Status: DC | PRN

## 2013-10-24 MED ORDER — KETOROLAC TROMETHAMINE 30 MG/ML IJ SOLN
30.0000 mg | Freq: Four times a day (QID) | INTRAMUSCULAR | Status: AC | PRN
  Administered 2013-10-24: 30 mg via INTRAVENOUS

## 2013-10-24 MED ORDER — DIPHENHYDRAMINE HCL 50 MG/ML IJ SOLN: 12.5000 mg | INTRAMUSCULAR | Status: DC | PRN

## 2013-10-24 MED ORDER — NALOXONE HCL 1 MG/ML IJ SOLN
1.0000 ug/kg/h | INTRAVENOUS | Status: DC | PRN
  Filled 2013-10-24: qty 2

## 2013-10-24 MED ORDER — SIMETHICONE 80 MG PO CHEW
80.0000 mg | CHEWABLE_TABLET | Freq: Three times a day (TID) | ORAL | Status: DC
  Administered 2013-10-24 – 2013-10-27 (×9): 80 mg via ORAL
  Filled 2013-10-24 (×10): qty 1

## 2013-10-24 MED ORDER — 0.9 % SODIUM CHLORIDE (POUR BTL) OPTIME
TOPICAL | Status: DC | PRN
  Administered 2013-10-24: 1000 mL

## 2013-10-24 MED ORDER — ONDANSETRON HCL 4 MG/2ML IJ SOLN
INTRAMUSCULAR | Status: DC | PRN
  Administered 2013-10-24: 4 mg via INTRAVENOUS

## 2013-10-24 MED ORDER — SCOPOLAMINE 1 MG/3DAYS TD PT72
MEDICATED_PATCH | TRANSDERMAL | Status: AC
  Filled 2013-10-24: qty 1

## 2013-10-24 MED ORDER — LABETALOL HCL 5 MG/ML IV SOLN
20.0000 mg | INTRAVENOUS | Status: DC | PRN
  Administered 2013-10-24: 20 mg via INTRAVENOUS
  Filled 2013-10-24: qty 4

## 2013-10-24 MED ORDER — DIBUCAINE 1 % RE OINT: 1.0000 "application " | TOPICAL_OINTMENT | RECTAL | Status: DC | PRN

## 2013-10-24 MED ORDER — MENTHOL 3 MG MT LOZG: 1.0000 | LOZENGE | OROMUCOSAL | Status: DC | PRN

## 2013-10-24 MED ORDER — METOCLOPRAMIDE HCL 5 MG/ML IJ SOLN: 10.0000 mg | Freq: Three times a day (TID) | INTRAMUSCULAR | Status: DC | PRN

## 2013-10-24 MED ORDER — PRENATAL MULTIVITAMIN CH
1.0000 | ORAL_TABLET | Freq: Every day | ORAL | Status: DC
  Administered 2013-10-24 – 2013-10-27 (×4): 1 via ORAL
  Filled 2013-10-24 (×4): qty 1

## 2013-10-24 MED ORDER — IBUPROFEN 600 MG PO TABS
600.0000 mg | ORAL_TABLET | Freq: Four times a day (QID) | ORAL | Status: DC
  Administered 2013-10-24 – 2013-10-27 (×13): 600 mg via ORAL
  Filled 2013-10-24 (×13): qty 1

## 2013-10-24 MED ORDER — MEPERIDINE HCL 25 MG/ML IJ SOLN
6.2500 mg | INTRAMUSCULAR | Status: DC | PRN
  Administered 2013-10-24: 6.25 mg via INTRAVENOUS

## 2013-10-24 MED ORDER — FENTANYL CITRATE 0.05 MG/ML IJ SOLN
INTRAMUSCULAR | Status: DC | PRN
  Administered 2013-10-23: 15 ug via INTRAVENOUS

## 2013-10-24 MED ORDER — LACTATED RINGERS IV SOLN
INTRAVENOUS | Status: AC
  Administered 2013-10-24: 07:00:00 via INTRAVENOUS

## 2013-10-24 MED ORDER — MORPHINE SULFATE (PF) 0.5 MG/ML IJ SOLN
INTRAMUSCULAR | Status: DC | PRN
  Administered 2013-10-23: 150 ug via EPIDURAL

## 2013-10-24 MED ORDER — MEPERIDINE HCL 25 MG/ML IJ SOLN: 6.2500 mg | INTRAMUSCULAR | Status: DC | PRN

## 2013-10-24 MED ORDER — SIMETHICONE 80 MG PO CHEW
80.0000 mg | CHEWABLE_TABLET | ORAL | Status: DC | PRN
  Administered 2013-10-26: 80 mg via ORAL

## 2013-10-24 MED ORDER — PROMETHAZINE HCL 25 MG/ML IJ SOLN: 6.2500 mg | INTRAMUSCULAR | Status: DC | PRN

## 2013-10-24 MED ORDER — PHENYLEPHRINE HCL 10 MG/ML IJ SOLN
20.0000 mg | INTRAVENOUS | Status: DC | PRN
  Administered 2013-10-23: 30 ug/min via INTRAVENOUS
  Administered 2013-10-24: 20 ug/min via INTRAVENOUS

## 2013-10-24 MED ORDER — ZOLPIDEM TARTRATE 5 MG PO TABS: 5.0000 mg | ORAL_TABLET | Freq: Every evening | ORAL | Status: DC | PRN

## 2013-10-24 MED ORDER — WITCH HAZEL-GLYCERIN EX PADS: 1.0000 "application " | MEDICATED_PAD | CUTANEOUS | Status: DC | PRN

## 2013-10-24 MED ORDER — DIPHENHYDRAMINE HCL 25 MG PO CAPS: 25.0000 mg | ORAL_CAPSULE | Freq: Four times a day (QID) | ORAL | Status: DC | PRN

## 2013-10-24 MED ORDER — DIPHENHYDRAMINE HCL 25 MG PO CAPS: 25.0000 mg | ORAL_CAPSULE | ORAL | Status: DC | PRN

## 2013-10-24 MED ORDER — TETANUS-DIPHTH-ACELL PERTUSSIS 5-2.5-18.5 LF-MCG/0.5 IM SUSP
0.5000 mL | Freq: Once | INTRAMUSCULAR | Status: DC
  Filled 2013-10-24: qty 0.5

## 2013-10-24 MED ORDER — SENNOSIDES-DOCUSATE SODIUM 8.6-50 MG PO TABS
2.0000 | ORAL_TABLET | ORAL | Status: DC
  Administered 2013-10-25 – 2013-10-26 (×3): 2 via ORAL
  Filled 2013-10-24 (×4): qty 2

## 2013-10-24 MED ORDER — LANOLIN HYDROUS EX OINT: 1.0000 "application " | TOPICAL_OINTMENT | CUTANEOUS | Status: DC | PRN

## 2013-10-24 MED ORDER — OXYCODONE-ACETAMINOPHEN 5-325 MG PO TABS: 1.0000 | ORAL_TABLET | ORAL | Status: DC | PRN

## 2013-10-24 MED ORDER — SODIUM CHLORIDE 0.9 % IJ SOLN: 3.0000 mL | INTRAMUSCULAR | Status: DC | PRN

## 2013-10-24 MED ORDER — ONDANSETRON HCL 4 MG/2ML IJ SOLN: 4.0000 mg | INTRAMUSCULAR | Status: DC | PRN

## 2013-10-24 MED ORDER — KETOROLAC TROMETHAMINE 30 MG/ML IJ SOLN
INTRAMUSCULAR | Status: AC
  Filled 2013-10-24: qty 1

## 2013-10-24 MED ORDER — AMLODIPINE BESYLATE 10 MG PO TABS
10.0000 mg | ORAL_TABLET | Freq: Every day | ORAL | Status: DC
  Administered 2013-10-24 – 2013-10-27 (×4): 10 mg via ORAL
  Filled 2013-10-24 (×4): qty 1

## 2013-10-24 MED ORDER — BUPIVACAINE IN DEXTROSE 0.75-8.25 % IT SOLN
INTRATHECAL | Status: DC | PRN
  Administered 2013-10-23: 12 mg via INTRATHECAL

## 2013-10-24 MED ORDER — NALBUPHINE SYRINGE 5 MG/0.5 ML
5.0000 mg | INJECTION | INTRAMUSCULAR | Status: DC | PRN
  Filled 2013-10-24: qty 1

## 2013-10-24 MED ORDER — SCOPOLAMINE 1 MG/3DAYS TD PT72
1.0000 | MEDICATED_PATCH | Freq: Once | TRANSDERMAL | Status: AC
  Administered 2013-10-24: 1.5 mg via TRANSDERMAL

## 2013-10-24 MED ORDER — MAGNESIUM SULFATE 40 G IN LACTATED RINGERS - SIMPLE
2.0000 g/h | INTRAVENOUS | Status: AC
  Administered 2013-10-24: 2 g/h via INTRAVENOUS
  Filled 2013-10-24 (×2): qty 500

## 2013-10-24 MED ORDER — OXYTOCIN 40 UNITS IN LACTATED RINGERS INFUSION - SIMPLE MED: 62.5000 mL/h | INTRAVENOUS | Status: AC

## 2013-10-24 MED ORDER — CEFAZOLIN SODIUM-DEXTROSE 2-3 GM-% IV SOLR
INTRAVENOUS | Status: DC | PRN
  Administered 2013-10-24: 2 g via INTRAVENOUS

## 2013-10-24 MED ORDER — PHENYLEPHRINE HCL 10 MG/ML IJ SOLN: INTRAMUSCULAR | Status: DC | PRN

## 2013-10-24 MED ORDER — DIPHENHYDRAMINE HCL 50 MG/ML IJ SOLN: 25.0000 mg | INTRAMUSCULAR | Status: DC | PRN

## 2013-10-24 MED ORDER — KETOROLAC TROMETHAMINE 30 MG/ML IJ SOLN: 30.0000 mg | Freq: Four times a day (QID) | INTRAMUSCULAR | Status: AC | PRN

## 2013-10-24 MED ORDER — ONDANSETRON HCL 4 MG/2ML IJ SOLN: 4.0000 mg | Freq: Three times a day (TID) | INTRAMUSCULAR | Status: DC | PRN

## 2013-10-24 MED ORDER — HYDROMORPHONE HCL PF 1 MG/ML IJ SOLN: 0.2500 mg | INTRAMUSCULAR | Status: DC | PRN

## 2013-10-24 MED ORDER — MEPERIDINE HCL 25 MG/ML IJ SOLN
INTRAMUSCULAR | Status: AC
  Filled 2013-10-24: qty 1

## 2013-10-24 MED ORDER — SIMETHICONE 80 MG PO CHEW
80.0000 mg | CHEWABLE_TABLET | ORAL | Status: DC
  Administered 2013-10-25: 80 mg via ORAL
  Filled 2013-10-24 (×3): qty 1

## 2013-10-24 MED ORDER — OXYTOCIN 10 UNIT/ML IJ SOLN
40.0000 [IU] | INTRAVENOUS | Status: DC | PRN
  Administered 2013-10-24: 40 [IU] via INTRAVENOUS

## 2013-10-24 NOTE — Progress Notes (Signed)
UR chart review completed.  

## 2013-10-24 NOTE — Op Note (Signed)
  12:48 AM  PATIENT:  Melissa Quinn  27 y.o. female  PRE-OPERATIVE DIAGNOSIS:  Fetal Intolerance to Labor, pregnancy 33+4 wks, severe preeclampsia  POST-OPERATIVE DIAGNOSIS:  Fetal Intolerance to Labor pregnancy 33+4 wks, severe preeclampsia   PROCEDURE:  Procedure(s): Primary Cesarean Section Delivery Baby Girl @ 0004, Apgars 8/8 (N/A)  SURGEON:  Surgeon(s) and Role:    * Tilda Burrow, MD - Primary  Details of procedure: Patient was taken operating room, prepped and draped after spinal anesthesia introduced. In the interim fetal monitoring showed persistent recurrent decelerations into the 90s the Pfannenstiel type incision was performed on lower abdomen, Alexis wound retractor positioned, bladder flap developed and transverse uterine incision made into the uterus extended transversely with the index finger traction, and fetal vertex delivered through the incision Apgars 8 and is in the pediatrics. An air-fluid was clear without malodor. The placenta was delivered intact Tomasa Blase presentation it appeared small but otherwise normal in appearance and the and and 9 fluid was clear as mentioned earlier, the umbilical cord had normal diameters, with Wharton's jelly present. The patient then had the placenta delivered, the uterus irrigated, with 2 layer uterine closure with running locking 0 chromic first layer in continuous running 0 chromic second layer. Bladder flap reapproximated naturally did not require suturing. Abdomen was irrigated. Anterior peritoneum closed with running 2-0 , the fascia closed with running 0 Vicryl, and the subcutaneous tissue reapproximated with interrupted 2-0 Vicryl then subcuticular 4-0 Vicryl used to close the skin. Steri-Strips were applied and patient recovery in stable condition sponge and needle counts correct end of dictation

## 2013-10-24 NOTE — Anesthesia Procedure Notes (Signed)
Spinal  Patient location during procedure: OR Start time: 10/24/2013 11:49 PM End time: 10/24/2013 11:51 PM Staffing Anesthesiologist: Lewie Loron R Performed by: anesthesiologist  Preanesthetic Checklist Completed: patient identified, site marked, surgical consent, pre-op evaluation, timeout performed, IV checked, risks and benefits discussed and monitors and equipment checked Spinal Block Patient position: sitting Prep: site prepped and draped Patient monitoring: heart rate, continuous pulse ox and blood pressure Approach: midline Location: L3-4 Injection technique: single-shot Needle Needle type: Sprotte  Needle gauge: 24 G Needle length: 9 cm Assessment Sensory level: T6 Additional Notes Expiration date of kit checked and confirmed. Patient tolerated procedure well, without complications.

## 2013-10-24 NOTE — Progress Notes (Signed)
Subjective: Postpartum Day 0: Cesarean Delivery Patient reports incisional pain and tolerating PO.   Foley still in place, no HA, vision changes or abdominal pain.  Objective: Vital signs in last 24 hours: Temp:  [98.1 F (36.7 C)-99.5 F (37.5 C)] 98.2 F (36.8 C) (11/13 0749) Pulse Rate:  [73-110] 86 (11/13 1000) Resp:  [11-20] 20 (11/13 0700) BP: (120-167)/(80-106) 156/99 mmHg (11/13 1000) SpO2:  [94 %-100 %] 97 % (11/13 1000) Weight:  [65.499 kg (144 lb 6.4 oz)] 65.499 kg (144 lb 6.4 oz) (11/12 1042) UOP >300cc/hr  Filed Vitals:   10/24/13 0749 10/24/13 0800 10/24/13 0900 10/24/13 1000  BP:  137/93 132/80 156/99  Pulse:  74 73 86  Temp: 98.2 F (36.8 C)     TempSrc: Oral     Resp:      Height:      Weight:      SpO2:  96% 97% 97%     Physical Exam:  General: alert, cooperative and appears stated age 27: appropriate Uterine Fundus: Firm, Incision: no significant drainage, no dehiscence, no significant erythema DVT Evaluation: No evidence of DVT seen on physical exam. Negative Homan's sign. No cords or calf tenderness. No significant calf/ankle edema. CTAB no wrc RRR no mgt 2+DTR no clonus    Recent Labs  10/23/13 1825 10/24/13 0510  HGB 12.2 11.0*  HCT 36.0 31.3*   CMP     Component Value Date/Time   NA 135 10/24/2013 0510   K 3.8 10/24/2013 0510   CL 99 10/24/2013 0510   CO2 26 10/24/2013 0510   GLUCOSE 86 10/24/2013 0510   BUN 10 10/24/2013 0510   CREATININE 0.56 10/24/2013 0510   CALCIUM 7.2* 10/24/2013 0510   PROT 5.4* 10/24/2013 0510   ALBUMIN 2.2* 10/24/2013 0510   AST 22 10/24/2013 0510   ALT 15 10/24/2013 0510   ALKPHOS 143* 10/24/2013 0510   BILITOT 0.1* 10/24/2013 0510   GFRNONAA >90 10/24/2013 0510   GFRAA >90 10/24/2013 0510   CBC    Component Value Date/Time   WBC 13.9* 10/24/2013 0510   RBC 3.50* 10/24/2013 0510   HGB 11.0* 10/24/2013 0510   HCT 31.3* 10/24/2013 0510   PLT 209 10/24/2013 0510   MCV 89.4 10/24/2013  0510   MCH 31.4 10/24/2013 0510   MCHC 35.1 10/24/2013 0510   RDW 14.7 10/24/2013 0510   LYMPHSABS 2.8 10/23/2013 1825   MONOABS 1.0 10/23/2013 1825   EOSABS 0.0 10/23/2013 1825   BASOSABS 0.0 10/23/2013 1825      Assessment/Plan: Status post Cesarean section. Doing well postoperatively.  Continue current care.  PreX: Continue 24 hr mag PP Continue monitoring UOP and BPs BPs seem to be down trending at this time. Continue to observe throughout the day. If persistently elevated tomorrow will start on CCB for improved control.  Pt will need baby love f/u at discharge.   Jolyn Lent RYAN 10/24/2013, 10:40 AM

## 2013-10-24 NOTE — Anesthesia Postprocedure Evaluation (Signed)
  Anesthesia Post-op Note  Patient: Melissa Quinn  Procedure(s) Performed: Procedure(s): Primary Cesarean Section Delivery Baby Girl @ 0004, Apgars 8/8 (N/A)  Patient Location: A-ICU  Anesthesia Type:Spinal  Level of Consciousness: awake, oriented and patient cooperative  Airway and Oxygen Therapy: Patient Spontanous Breathing  Post-op Pain: none  Post-op Assessment: Patient's Cardiovascular Status Stable, Respiratory Function Stable, Patent Airway, No signs of Nausea or vomiting, Adequate PO intake, Pain level controlled, No headache, No backache, No residual numbness and No residual motor weakness  Post-op Vital Signs: Reviewed and stable  Complications: No apparent anesthesia complications

## 2013-10-24 NOTE — Transfer of Care (Signed)
Immediate Anesthesia Transfer of Care Note  Patient: Melissa Quinn  Procedure(s) Performed: Procedure(s): Primary Cesarean Section Delivery Baby Girl @ 0004, Apgars 8/8 (N/A)  Patient Location: PACU  Anesthesia Type:Spinal  Level of Consciousness: awake, alert  and oriented  Airway & Oxygen Therapy: Patient Spontanous Breathing  Post-op Assessment: Report given to PACU RN and Post -op Vital signs reviewed and stable  Post vital signs: Reviewed and stable  Complications: No apparent anesthesia complications

## 2013-10-24 NOTE — Lactation Note (Signed)
This note was copied from the chart of Melissa Quinn. Lactation Consultation Note    Initial consult with this mom of a NICU baby, 33 4/[redacted] weeks gestation, delivered due to mom's pre eclampsia and hypertension.   Mom has been pumping every 3 hours, and expressing good amounts of colostrum . Mom is a P4, but only breast fed her 27 year old.She used a hand pump to express milk for one of her children, who was also a preterm baby. Mom is active with WIC, and knows to call to add baby, and set up receiving DEP. I showed mom how to hadn express, and she return demonstrated with fair technique. She was able to collect about 2 mls. Teaching from the NICU booklet on providing milk for a NICU baby sone. i will follow up with this mom and baby in the NICU.  Patient Name: Melissa Quinn [id number] Date: 10/24/2013 Reason for consult: Initial assessment;NICU baby;Late preterm infant;Infant < 6lbs   Maternal Data Formula Feeding for Exclusion: Yes (baby in NICU) Reason for exclusion: Admission to Intensive Care Unit (ICU) post-partum Infant to breast within first hour of birth: No Breastfeeding delayed due to:: Infant status Has patient been taught Hand Expression?: Yes Does the patient have breastfeeding experience prior to this delivery?: Yes  Feeding    LATCH Score/Interventions                      Lactation Tools Discussed/Used Tools: Pump Breast pump type: Double-Electric Breast Pump WIC Program: Yes Pump Review: Setup, frequency, and cleaning;Milk Storage;Other (comment) (hand exp, teaching from the NICU booklet on EBM) Initiated by:: bedside RN Date initiated:: 10/24/13   Consult Status Consult Status: Follow-up Date: 10/25/13 Follow-up type: In-patient    Alfred Levins 10/24/2013, 5:01 PM

## 2013-10-24 NOTE — Brief Op Note (Signed)
10/21/2013 - 10/24/2013  12:48 AM  PATIENT:  Melissa Quinn  27 y.o. female  PRE-OPERATIVE DIAGNOSIS:  Fetal Intolerance to Labor, pregnancy 33+4 wks, severe preeclampsia  POST-OPERATIVE DIAGNOSIS:  Fetal Intolerance to Labor pregnancy 33+4 wks, severe preeclampsia   PROCEDURE:  Procedure(s): Primary Cesarean Section Delivery Baby Girl @ 0004, Apgars 8/8 (N/A)  SURGEON:  Surgeon(s) and Role:    * Tilda Burrow, MD - Primary  PHYSICIAN ASSISTANT:   ASSISTANTS: none   ANESTHESIA:   spinal  EBL:  Total I/O In: 1843.3 [P.O.:260; I.V.:1583.3] Out: 2325 [Urine:1825; Blood:500]  BLOOD ADMINISTERED:none  DRAINS: Urinary Catheter (Foley)   LOCAL MEDICATIONS USED:  NONE  SPECIMEN:  Source of Specimen:  Placenta   DISPOSITION OF SPECIMEN:  PATHOLOGY  COUNTS:  YES  TOURNIQUET:  * No tourniquets in log *  DICTATION: .Dragon Dictation  PLAN OF CARE: Admit to inpatient   PATIENT DISPOSITION:  PACU - hemodynamically stable.   Delay start of Pharmacological VTE agent (>24hrs) due to surgical blood loss or risk of bleeding: not applicable

## 2013-10-25 NOTE — Lactation Note (Signed)
This note was copied from the chart of Melissa Alishah Schulte. Lactation Consultation Note    Follow up brief consult with this mom of a NICU baby, now 36 hours post partum. Mom reports she has been pumping and getting small  Amounts of colsotrum. I reminded her to call [facility] to add baby, and let them know she is in NICU and will need a DEP. MOm has been doing skin to skin with her baby. She attempted nuzzling, but baby sleepy. I will follow this family in the NICU.  Patient Name: Melissa Quinn [id number] Date: 10/25/2013     Maternal Data    Feeding Feeding Type: Formula Length of feed: 20 min  LATCH Score/Interventions                      Lactation Tools Discussed/Used     Consult Status      Alfred Levins 10/25/2013, 5:30 PM

## 2013-10-25 NOTE — Progress Notes (Signed)
Pt still in NICU, but aware she is being transferred to the floor  - belongings transferred to Rm #[id number] after report to [facility], RN.

## 2013-10-25 NOTE — Progress Notes (Signed)
Subjective:cramps, no headache Postpartum Day 1: Cesarean Delivery Patient reports incisional pain, tolerating PO and no problems voiding.    Objective: Vital signs in last 24 hours: Temp:  [98.2 F (36.8 C)-98.7 F (37.1 C)] 98.3 F (36.8 C) (11/14 0412) Pulse Rate:  [67-98] 81 (11/14 0600) Resp:  [16-18] 18 (11/14 0412) BP: (101-163)/(69-117) 110/79 mmHg (11/14 0600) SpO2:  [96 %-100 %] 98 % (11/14 0600)  Physical Exam:  General: alert, cooperative and no distress Lochia: appropriate Uterine Fundus: firm Incision: healing well DVT Evaluation: No evidence of DVT seen on physical exam.   Recent Labs  10/23/13 1825 10/24/13 0510  HGB 12.2 11.0*  HCT 36.0 31.3*    Assessment/Plan: Status post Cesarean section. Doing well postoperatively.  BP normal, Mg d/c, transfer to [facility].  Takeria Marquina 10/25/2013, 7:46 AM

## 2013-10-26 ENCOUNTER — Encounter (HOSPITAL_COMMUNITY): Payer: Self-pay | Admitting: Obstetrics and Gynecology

## 2013-10-26 NOTE — Progress Notes (Signed)
Subjective: Postpartum Day 2: Cesarean Delivery Patient reports tolerating PO, + flatus, + BM and no problems voiding.    Objective: Vital signs in last 24 hours: Temp:  [98.4 F (36.9 C)-99.2 F (37.3 C)] 98.4 F (36.9 C) (11/15 0514) Pulse Rate:  [73-96] 73 (11/15 0514) Resp:  [16-18] 16 (11/15 0514) BP: (115-144)/(73-87) 133/76 mmHg (11/15 0514) SpO2:  [97 %-99 %] 98 % (11/15 0514) Weight:  [137 lb 12 oz (62.483 kg)-140 lb (63.504 kg)] 137 lb 12 oz (62.483 kg) (11/15 0514)  Physical Exam:  General: alert, cooperative and no distress Lochia: appropriate Uterine Fundus: firm Incision: healing well, dressing is dry DVT Evaluation: No evidence of DVT seen on physical exam.   Recent Labs  10/23/13 1825 10/24/13 0510  HGB 12.2 11.0*  HCT 36.0 31.3*    Assessment/Plan: Status post Cesarean section. Doing well postoperatively.  Home tomorrow.  EURE,LUTHER H 10/26/2013, 7:28 AM

## 2013-10-26 NOTE — Progress Notes (Signed)
Clinical Social Work Department PSYCHOSOCIAL ASSESSMENT - MATERNAL/CHILD 10/26/2013  Patient:  Melissa Quinn, Melissa Quinn  Account Number:  [id number]  Admit Date:  10/21/2013  Melissa Quinn    Clinical Social Worker:  Ercilia Bettinger, LCSW   Date/Time:  10/26/2013 12:45 PM  Date Referred:  10/25/2013      Referred reason  NICU   Other referral source:    I:  FAMILY / HOME ENVIRONMENT Child's legal guardian:  PARENT  Guardian - Name Guardian - Age Guardian - Address  Melissa Quinn, Melissa Quinn [address]  [place], [place] [address]  Melissa Quinn  same as above   Other household support members/support persons Other support:    II  PSYCHOSOCIAL DATA Information Source:  Patient Interview  Event organiser Employment:   Surveyor, quantity resources:  Media planner If [employer] - [place]:    School / Grade:   Maternity Care Coordinator / Child Services Coordination / Early Interventions:  Cultural issues impacting care:    III  STRENGTHS Strengths  Supportive family/friends  Home prepared for Child (including basic supplies)  Adequate Resources   Strength comment:    IV  RISK FACTORS AND CURRENT PROBLEMS Current Problem:       V  SOCIAL WORK ASSESSMENT Met with mother who was pleasant and receptive to social work intervention.  Spouse and several other visitors were present during CSW visit.  Parents are married.  They have three other dependents ages 66,11, and 41.  Mother notes that her first child was premature and stayed in NICU for over Quinn month.    Both parents seems to be coping well with newborn NICU admission.  Informed that they have spoken with the medical team and was pleased with how well newborn is doing.    Parents communicate hopes that infant will continue to do well.  Mother denies any hx of substance abuse or mental illness.   No acute social concerns related at this time.   Mother states that she does not anticipate any  transportation issues to visit with patient once she's discharge.      VI SOCIAL WORK PLAN Social Work Plan  Psychosocial Support/Ongoing Assessment of Needs   Melissa Quinn J, LCSW

## 2013-10-27 MED ORDER — OXYCODONE-ACETAMINOPHEN 5-325 MG PO TABS: 1.0000 | ORAL_TABLET | ORAL | Status: DC | PRN

## 2013-10-27 MED ORDER — IBUPROFEN 600 MG PO TABS: 600.0000 mg | ORAL_TABLET | Freq: Four times a day (QID) | ORAL | Status: DC

## 2013-10-27 MED ORDER — AMLODIPINE BESYLATE 10 MG PO TABS: 10.0000 mg | ORAL_TABLET | Freq: Every day | ORAL | Status: DC

## 2013-10-27 NOTE — Discharge Summary (Signed)
Obstetric Discharge Summary Reason for Admission: induction of labor, observation/evaluation and IOL for GHTN, did not tolerate medical IOL, moved to LTCS Prenatal Procedures: NST and ultrasound Intrapartum Procedures: cesarean: low cervical, transverse Postpartum Procedures: magnesium Complications-Operative and Postpartum: none Hemoglobin  Date Value Range Status  10/24/2013 11.0* 12.0 - 15.0 g/dL Final     HCT  Date Value Range Status  10/24/2013 31.3* 36.0 - 46.0 % Final    Physical Exam:  General: alert, cooperative, appears stated age and no distress Lochia: appropriate Uterine Fundus: Firm U-2 CTAB no wrc RRR no mgt Incision: healing well, no significant drainage, no dehiscence, no significant erythema, honey comb still in place DVT Evaluation: No evidence of DVT seen on physical exam. Negative Homan's sign. No cords or calf tenderness. No significant calf/ankle edema.  Discharge Diagnoses: Preelampsia and PLTCS for inability to tolerate IOL  Discharge Information: Date: 10/27/2013 Activity: pelvic rest Diet: routine Medications: PNV, Ibuprofen, Percocet and norvasc 10mg   Condition: stable Instructions: refer to practice specific booklet Discharge to: home Follow-up Information   Follow up with [facility]. Schedule an appointment as soon as possible for a visit in 4 weeks.   Specialty:  Obstetrics and Gynecology   Contact information:   [address] [place] [address] [phone]      Newborn Data: Live born female  Birth Weight: 4 lb 3.7 oz (1920 g) APGAR: 8, 8  Stay in NICU  Hospital Course: Pt admitted for observation of BPs on 10Nov. Pt progressed to severe feattures with RUQ pain and scotoma. Decision to induce was made on 11/11. Fetus did not tolerate induction with recurrent decels. Decision to proceed to section on 11/12. Infant has been in NICU since delivery and appears to be progressing well. Maternal course complicated  with persistently elevated BPs following delivery. Pt was started on Norvasc 10mg  and has had signfiicant improvemetn in pressures. Continued to have good diuresis. Will discharge home on this medication with 1 month supply and 1 refill. Will also have baby love visit next week for evaluation of BPs. Desires nexplanon for birth control. Currently pumping with good supply for infant in NICU.  Melissa Quinn 10/27/2013, 8:43 AM

## 2013-10-27 NOTE — Progress Notes (Signed)
Discharge instructions reviewed with patient.  Patient states understanding of home care, medications, activity, signs/symptoms to report to MD and return MD office visit.  Patient will contact [facility] on Tuesday for breast pump.  No other home equipment needed.  Patient ambulated for discharge in stable condition with staff without incident.

## 2013-11-04 NOTE — Anesthesia Postprocedure Evaluation (Signed)
Anesthesia Post Note  Patient: Melissa Quinn  Procedure(s) Performed: Procedure(s) (LRB): Primary Cesarean Section Delivery Baby Girl @ 0004, Apgars 8/8 (N/A)  Anesthesia type: Spinal  Patient location: PACU  Post pain: Pain level controlled  Post assessment: Post-op Vital signs reviewed  Last Vitals: BP 138/91  Pulse 92  Temp(Src) 36.7 C (Oral)  Resp 18  Ht 4\' 9"  (1.448 m)  Wt 136 lb 14.5 oz (62.1 kg)  BMI 29.62 kg/m2  SpO2 98%  LMP 02/27/2013  Breastfeeding? Unknown  Post vital signs: Reviewed  Level of consciousness: sedated  Complications: No apparent anesthesia complications

## 2013-11-25 ENCOUNTER — Ambulatory Visit (INDEPENDENT_AMBULATORY_CARE_PROVIDER_SITE_OTHER): Payer: Medicaid Other | Admitting: Obstetrics & Gynecology

## 2013-11-25 ENCOUNTER — Encounter: Payer: Self-pay | Admitting: Obstetrics & Gynecology

## 2013-11-25 NOTE — Progress Notes (Signed)
  Subjective:     Melissa Quinn is a 27 y.o. [phone] female who presents for a postpartum visit. She is status post PLTCS on 10/24/13 at [redacted]w[redacted]d secondary to fetal intolerance of labor in the setting of severe preeclampsia.   Anesthesia: spinal. Postpartum course has been uncomplicated. Baby's course has been uncomplicated. Baby is feeding by breast and bottle. Having scant lochia. Bowel function is normal. Bladder function is normal. Patient is not sexually active. Preferred contraception method is Mirena IUD or Nexplanon, patient [facility]. Postpartum depression screening: negative.  The following portions of the patient's history were reviewed and updated as appropriate: allergies, current medications, past family history, past medical history, past social history, past surgical history and problem list.  Last pap smear on 04/29/13 was LGSIL, +HPV, no colposcopy done during pregnancy.  Review of Systems Pertinent items are noted in HPI.   Objective:    BP 123/83  Pulse 81  Temp(Src) 98 F (36.7 C)  Wt 131 lb 12.8 oz (59.784 kg)  Breastfeeding? No  General:  cooperative and no distress   Breasts:  deferred  Abdomen: soft, non-tender; bowel sounds normal; no masses,  no organomegaly. Incision has helead very well, no erythema  Pelvic:  deferred  Extremities: No edema, cyanosis, clubbing.  Nontender.        Assessment:   Normal postpartum exam. Pap smear not done at today's visit.   Plan:   1. Contraception: Mirena IUD or Nexplanon, patient to follow up with [facility].  2. LGSIL pap: Patient also to get colposcopy at the [facility] 3. Antepartum severe preeclampsia: Will continue Norvasc for two more weeks then stop; patient to check BP at [facility] 4  Follow up as needed.   Jaynie Collins, MD, FACOG Attending Obstetrician & Gynecologist Faculty Practice, [facility]

## 2013-11-25 NOTE — Patient Instructions (Addendum)
Return to clinic for any scheduled appointments or for any gynecologic concerns as needed.   

## 2013-11-25 NOTE — Progress Notes (Signed)
Patient is interested in nexplanon or mirena. Advised her that we have an application that she can apply for a free Mirena and only pay insertion cost. Patient states that she will discuss with provider.

## 2014-04-20 ENCOUNTER — Emergency Department (HOSPITAL_COMMUNITY)
Admission: EM | Admit: 2014-04-20 | Discharge: 2014-04-20 | Disposition: A | Discharge status: Home / Self Care | Payer: Self-pay | Attending: Emergency Medicine | Admitting: Emergency Medicine

## 2014-04-20 ENCOUNTER — Emergency Department (HOSPITAL_COMMUNITY): Payer: Self-pay

## 2014-04-20 DIAGNOSIS — W010XXA Fall on same level from slipping, tripping and stumbling without subsequent striking against object, initial encounter: Secondary | ICD-10-CM | POA: Insufficient documentation

## 2014-04-20 DIAGNOSIS — Y9229 Other specified public building as the place of occurrence of the external cause: Secondary | ICD-10-CM | POA: Insufficient documentation

## 2014-04-20 DIAGNOSIS — Y939 Activity, unspecified: Secondary | ICD-10-CM | POA: Insufficient documentation

## 2014-04-20 DIAGNOSIS — Z880 Allergy status to penicillin: Secondary | ICD-10-CM | POA: Insufficient documentation

## 2014-04-20 DIAGNOSIS — S8990XA Unspecified injury of unspecified lower leg, initial encounter: Secondary | ICD-10-CM | POA: Insufficient documentation

## 2014-04-20 DIAGNOSIS — S99929A Unspecified injury of unspecified foot, initial encounter: Principal | ICD-10-CM

## 2014-04-20 DIAGNOSIS — S99919A Unspecified injury of unspecified ankle, initial encounter: Principal | ICD-10-CM

## 2014-04-20 DIAGNOSIS — S99912A Unspecified injury of left ankle, initial encounter: Secondary | ICD-10-CM

## 2014-04-20 DIAGNOSIS — Z8679 Personal history of other diseases of the circulatory system: Secondary | ICD-10-CM | POA: Insufficient documentation

## 2014-04-20 DIAGNOSIS — Z8619 Personal history of other infectious and parasitic diseases: Secondary | ICD-10-CM | POA: Insufficient documentation

## 2014-04-20 DIAGNOSIS — Z79899 Other long term (current) drug therapy: Secondary | ICD-10-CM | POA: Insufficient documentation

## 2014-04-20 MED ORDER — NAPROXEN 500 MG PO TABS: 500.0000 mg | ORAL_TABLET | Freq: Two times a day (BID) | ORAL | Status: DC

## 2014-04-20 NOTE — Discharge Instructions (Signed)
Take the prescribed medication as directed. Follow-up with orthopedics if no improvement within 1 week. Return to the ED for new or worsening symptoms.

## 2014-04-20 NOTE — ED Provider Notes (Signed)
CSN: [id number]     Arrival date & time 04/20/14  3474 History   This chart was scribed for non-physician practitioner Melissa Carnes, PA-C, working with Melissa Furry, MD, by Melissa Quinn, ED Scribe. This patient was seen in room WTR8/WTR8 and the patient's care was started at 7:41 PM.  First MD Initiated Contact with Patient 04/20/14 1914     Chief Complaint  Patient presents with  . Fall    HPI Melissa Quinn is a 28 y.o. female who presents to the ED complaining of a fall which occurred PTA at [facility] when she slipped in water. She reports that when she fell, one of her legs was behind her. She denies head trauma or LOC. The pt is complaining of right ankle pain which is increased with weight-bearing and flexion. She has also experienced swelling associated with the ankle pain. Ms. Melissa Quinn is ambulatory, but she reports the pain is increased with ambulation. She denies prior injuries to her right foot.   Past Medical History  Diagnosis Date  . Pregnancy induced hypertension   . Chlamydia 2011  . Abnormal Pap smear     colpo 2013   Past Surgical History  Procedure Laterality Date  . No past surgeries    . Cesarean section N/A 10/23/2013    Procedure: Primary Cesarean Section Delivery Baby Girl @ 0004, Apgars 8/8;  Surgeon: Melissa Kind, MD;  Location: [facility];  Service: Obstetrics;  Laterality: N/A;   Family History  Problem Relation Age of Onset  . Diabetes Mother   . Hypertension Mother   . Hypertension Sister   . Asthma Son   . Diabetes Maternal Grandmother    History  Substance Use Topics  . Smoking status: Never Smoker   . Smokeless tobacco: Never Used  . Alcohol Use: No   OB History   Grav Para Term Preterm Abortions TAB SAB Ect Mult Living   4 4 2 2      4      Review of Systems  Musculoskeletal: Positive for arthralgias.  Neurological: Negative for syncope.    Allergies  Penicillins  Home Medications   Prior to Admission medications   Medication Sig  Start Date End Date Taking? Authorizing Provider  amLODipine (NORVASC) 10 MG tablet Take 1 tablet (10 mg total) by mouth daily. 10/27/13   Melissa Norris, MD  ibuprofen (ADVIL,MOTRIN) 600 MG tablet Take 1 tablet (600 mg total) by mouth every 6 (six) hours. 10/27/13   Melissa Norris, MD  oxyCODONE-acetaminophen (PERCOCET/ROXICET) 5-325 MG per tablet Take 1-2 tablets by mouth every 4 (four) hours as needed for severe pain (moderate - severe pain). 10/27/13   Melissa Norris, MD  Prenatal Vit-Fe Fumarate-FA (PRENATAL COMPLETE) 14-0.4 MG TABS Take 1 tablet by mouth daily. 04/16/13   Alvina Chou, PA-C   Triage Vitals: BP 158/88  Pulse 82  Temp(Src) 98.3 F (36.8 C) (Oral)  Resp 18  SpO2 100%  Physical Exam  Nursing note and vitals reviewed. Constitutional: She is oriented to person, place, and time. She appears well-developed and well-nourished. No distress.  HENT:  Head: Normocephalic and atraumatic.  Eyes: EOM are normal.  Neck: Neck supple. No tracheal deviation present.  Cardiovascular: Normal rate.   Pulmonary/Chest: Effort normal. No respiratory distress.  Musculoskeletal:       Right ankle: She exhibits decreased range of motion and swelling. Tenderness. AITFL tenderness found.  Neurological: She is alert and oriented to person, place, and time.  Skin: Skin  is warm and dry.  Psychiatric: She has a normal mood and affect. Her behavior is normal.    ED Course  Procedures (including critical care time)  DIAGNOSTIC STUDIES: Oxygen Saturation is 100% on room air, normal by my interpretation.    COORDINATION OF CARE:  7:44 PM- Discussed treatment plan with patient, and the patient agreed to the plan. The plan includes an ankle brace and medication for the pain.   Labs Review Labs Reviewed - No data to display  Imaging Review Dg Ankle Complete Right  04/20/2014   CLINICAL DATA:  FALL  EXAM: RIGHT ANKLE - COMPLETE 3+ VIEW  COMPARISON:  None.  FINDINGS: There is no evidence of  fracture, dislocation, or joint effusion. There is no evidence of arthropathy or other focal bone abnormality. Soft tissues are unremarkable.  IMPRESSION: Negative.   Electronically Signed   By: Melissa Quinn M.D.   On: 04/20/2014 19:31     EKG Interpretation None      MDM   Final diagnoses:  Left ankle injury   X-ray negative for acute fracture or dislocation. Suspect mild sprain. Patient placed in ASO splint.    Rx naprosyn.  Recommended RICE routine at home for added relief.  She will FU with orthopedics if no improvement within one week.  Discussed plan with patient, he/she acknowledged understanding and agreed with plan of care.  Return precautions given for new or worsening symptoms.  I personally performed the services described in this documentation, which was scribed in my presence. The recorded information has been reviewed and is accurate.  Melissa Pickett, PA-C 04/20/14 [phone]

## 2014-04-20 NOTE — ED Notes (Signed)
Pt states that she fell yesterday at [facility] on some water and now c/o R ankle pain and tingling at her C-section site that she had done 6 months ago. Alert and oriented. No head trauma or LOC.

## 2014-04-23 NOTE — ED Provider Notes (Signed)
Medical screening examination/treatment/procedure(s) were performed by non-physician practitioner and as supervising physician I was immediately available for consultation/collaboration.   EKG Interpretation None        Tanna Furry, MD 04/23/14 [phone]

## 2014-10-13 ENCOUNTER — Encounter: Payer: Self-pay | Admitting: Obstetrics & Gynecology

## 2015-11-11 ENCOUNTER — Ambulatory Visit: Payer: Self-pay | Attending: Family Medicine

## 2015-12-10 ENCOUNTER — Ambulatory Visit: Payer: Self-pay | Attending: Family Medicine | Admitting: Family Medicine

## 2015-12-10 ENCOUNTER — Encounter: Payer: Self-pay | Admitting: Family Medicine

## 2015-12-10 VITALS — BP 125/89 | HR 75 | Temp 98.3°F | Resp 16 | Ht 60.0 in | Wt 149.0 lb

## 2015-12-10 DIAGNOSIS — R51 Headache: Secondary | ICD-10-CM | POA: Insufficient documentation

## 2015-12-10 DIAGNOSIS — I10 Essential (primary) hypertension: Secondary | ICD-10-CM | POA: Insufficient documentation

## 2015-12-10 DIAGNOSIS — R42 Dizziness and giddiness: Secondary | ICD-10-CM | POA: Insufficient documentation

## 2015-12-10 LAB — CBC
HCT: 39.6 % (ref 36.0–46.0)
Hemoglobin: 13.2 g/dL (ref 12.0–15.0)
MCH: 30.3 pg (ref 26.0–34.0)
MCHC: 33.3 g/dL (ref 30.0–36.0)
MCV: 90.8 fL (ref 78.0–100.0)
MPV: 10.4 fL (ref 8.6–12.4)
PLATELETS: 338 10*3/uL (ref 150–400)
RBC: 4.36 MIL/uL (ref 3.87–5.11)
RDW: 13.8 % (ref 11.5–15.5)
WBC: 8.6 10*3/uL (ref 4.0–10.5)

## 2015-12-10 LAB — COMPLETE METABOLIC PANEL WITH GFR
ALT: 43 U/L — ABNORMAL HIGH (ref 6–29)
AST: 26 U/L (ref 10–30)
Albumin: 4.4 g/dL (ref 3.6–5.1)
Alkaline Phosphatase: 72 U/L (ref 33–115)
BILIRUBIN TOTAL: 0.5 mg/dL (ref 0.2–1.2)
BUN: 9 mg/dL (ref 7–25)
CALCIUM: 9.6 mg/dL (ref 8.6–10.2)
CO2: 26 mmol/L (ref 20–31)
Chloride: 104 mmol/L (ref 98–110)
Creat: 0.61 mg/dL (ref 0.50–1.10)
GFR, Est Non African American: >89 mL/min (ref >=60)
Glucose, Bld: 93 mg/dL (ref 65–99)
Potassium: 4.9 mmol/L (ref 3.5–5.3)
Sodium: 139 mmol/L (ref 135–146)
Total Protein: 7.4 g/dL (ref 6.1–8.1)

## 2015-12-10 LAB — TSH: TSH: 1.21 u[IU]/mL (ref 0.350–4.500)

## 2015-12-10 LAB — POCT GLYCOSYLATED HEMOGLOBIN (HGB A1C): Hemoglobin A1C: 5.8

## 2015-12-10 MED ORDER — LOSARTAN POTASSIUM 50 MG PO TABS: 50.0000 mg | ORAL_TABLET | Freq: Every day | ORAL | Status: DC

## 2015-12-10 NOTE — Assessment & Plan Note (Signed)
A: BP well controlled on losartan.  P: Continue losartan Labs per orders Recommended low salt diet and regular exercise to help with BP control  Counseled regarding warning signs of angioedema Counseled regarding need to change BP medicine prior to conceiving if it is still needed

## 2015-12-10 NOTE — Patient Instructions (Addendum)
Justyce was seen today for hypertension and establish care.  Diagnoses and all orders for this visit:  Essential hypertension -     losartan (COZAAR) 50 MG tablet; Take 1 tablet (50 mg total) by mouth daily. -     COMPLETE METABOLIC PANEL WITH GFR -     CBC   You will be called with lab results   Remember with implant does increase appetite, so eat, but eat well. Snack on veggies, lean protein, cheese. Low carb and low salt foods.  F/u in 6 months for HTN  Dr. Adrian Blackwater

## 2015-12-10 NOTE — Progress Notes (Signed)
Patient ID: Melissa Quinn, female   DOB: Nov 09, 1986, 29 y.o.   MRN: [id number]  Subjective:  Patient ID: Melissa Quinn, female    DOB: [birth year]  Age: 29 y.o. MRN: [id number]  CC: Hypertension and Establish Care   HPI Melissa Quinn presents for   1. CHRONIC HYPERTENSION first diagnosed during the last pregnancy in October 2014, she developed pre-eclampsia. She was treated for a short time. She gained weight post partum. She developed dizziness and headache in July of 2016. She started back on treatment in October 2016.   Disease Monitoring  Blood pressure range: not checking   Chest pain: no   Dyspnea: no   Claudication: no   Medication compliance: yes  Medication Side Effects  Lightheadedness: no   Urinary frequency: no   Edema: no    Preventitive Healthcare:  Exercise: yes, minimal     2. Weight gain: she has nexplanon in place. She desires one more baby win 1-2 years. She would like to be sure that her BP is well controlled prior to conceiving.   Past Medical History  Diagnosis Date  . Pregnancy induced hypertension   . Chlamydia 2011  . Abnormal Pap smear     colpo 2013    Past Surgical History  Procedure Laterality Date  . No past surgeries    . Cesarean section N/A 10/23/2013    Procedure: Primary Cesarean Section Delivery Baby Girl @ 0004, Apgars 8/8;  Surgeon: Jonnie Kind, MD;  Location: [facility];  Service: Obstetrics;  Laterality: N/A;    Family History  Problem Relation Age of Onset  . Diabetes Mother   . Hypertension Mother   . Hypertension Sister   . Asthma Son   . Diabetes Maternal Grandmother     Social History  Substance Use Topics  . Smoking status: Never Smoker   . Smokeless tobacco: Never Used  . Alcohol Use: No    ROS Review of Systems  Constitutional: Negative for fever and chills.  Eyes: Negative for visual disturbance.  Respiratory: Negative for shortness of breath.   Cardiovascular: Negative for chest pain.   Gastrointestinal: Negative for abdominal pain and blood in stool.  Musculoskeletal: Negative for back pain and arthralgias.  Skin: Negative for rash.  Allergic/Immunologic: Negative for immunocompromised state.  Hematological: Negative for adenopathy. Does not bruise/bleed easily.  Psychiatric/Behavioral: Negative for suicidal ideas and dysphoric mood.    Objective:   Today's Vitals: BP 125/89 mmHg  Pulse 75  Temp(Src) 98.3 F (36.8 C) (Oral)  Resp 16  Ht 5' (1.524 m)  Wt 149 lb (67.586 kg)  BMI 29.10 kg/m2  SpO2 99% BP Readings from Last 3 Encounters:  12/10/15 125/89  04/20/14 158/88  11/25/13 123/83   Physical Exam  Constitutional: She is oriented to person, place, and time. She appears well-developed and well-nourished. No distress.  HENT:  Head: Normocephalic and atraumatic.  Cardiovascular: Normal rate, regular rhythm, normal heart sounds and intact distal pulses.   Pulmonary/Chest: Effort normal and breath sounds normal.  Musculoskeletal: She exhibits no edema.  Neurological: She is alert and oriented to person, place, and time.  Skin: Skin is warm and dry. No rash noted.  Psychiatric: She has a normal mood and affect.    Assessment & Plan:   Problem List Items Addressed This Visit    HTN (hypertension) - Primary   Relevant Medications   losartan (COZAAR) 50 MG tablet   Other Relevant Orders   COMPLETE METABOLIC PANEL WITH  GFR   CBC   HgB A1c   TSH      Outpatient Encounter Prescriptions as of 12/10/2015  Medication Sig  . losartan (COZAAR) 50 MG tablet Take 50 mg by mouth daily.  Marland Kitchen amLODipine (NORVASC) 10 MG tablet Take 1 tablet (10 mg total) by mouth daily. (Patient not taking: Reported on 12/10/2015)  . ibuprofen (ADVIL,MOTRIN) 600 MG tablet Take 1 tablet (600 mg total) by mouth every 6 (six) hours. (Patient not taking: Reported on 12/10/2015)  . naproxen (NAPROSYN) 500 MG tablet Take 1 tablet (500 mg total) by mouth 2 (two) times daily with a meal.  (Patient not taking: Reported on 12/10/2015)  . oxyCODONE-acetaminophen (PERCOCET/ROXICET) 5-325 MG per tablet Take 1-2 tablets by mouth every 4 (four) hours as needed for severe pain (moderate - severe pain). (Patient not taking: Reported on 12/10/2015)  . Prenatal Vit-Fe Fumarate-FA (PRENATAL COMPLETE) 14-0.4 MG TABS Take 1 tablet by mouth daily. (Patient not taking: Reported on 12/10/2015)   No facility-administered encounter medications on file as of 12/10/2015.    Follow-up: No Follow-up on file.    Boykin Nearing MD

## 2015-12-10 NOTE — Progress Notes (Signed)
Establish Care Hx HTN No pain today  No tobacco user. No suicidal thought in the past two weeks

## 2015-12-15 ENCOUNTER — Telehealth: Payer: Self-pay

## 2015-12-15 NOTE — Telephone Encounter (Signed)
Spoke with patient this am and she is aware of her lab results 

## 2015-12-15 NOTE — Telephone Encounter (Signed)
-----   Message from Boykin Nearing, MD sent at 12/11/2015  8:48 AM EST ----- All labs normal except slightly elevated ALT. Will recheck at f/u  A1c normal, no diabetes

## 2016-01-29 MED FILL — LOSARTAN POTASSIUM 50 MG TA: 50 | 30 days supply | Qty: 30 | Fill #1

## 2016-03-18 MED FILL — LOSARTAN POTASSIUM 50 MG TA: 50 | 30 days supply | Qty: 30 | Fill #2

## 2016-05-13 MED FILL — LOSARTAN POTASSIUM 50 MG TA: 50 | 30 days supply | Qty: 30 | Fill #3

## 2016-05-19 ENCOUNTER — Ambulatory Visit: Payer: Self-pay | Attending: Internal Medicine

## 2016-05-24 ENCOUNTER — Encounter: Payer: Self-pay | Admitting: Family Medicine

## 2016-05-24 ENCOUNTER — Ambulatory Visit: Payer: Self-pay | Attending: Family Medicine | Admitting: Family Medicine

## 2016-05-24 VITALS — BP 123/77 | HR 90 | Temp 98.8°F | Resp 16 | Ht 60.0 in | Wt 154.0 lb

## 2016-05-24 DIAGNOSIS — Z79899 Other long term (current) drug therapy: Secondary | ICD-10-CM | POA: Insufficient documentation

## 2016-05-24 DIAGNOSIS — Z3201 Encounter for pregnancy test, result positive: Secondary | ICD-10-CM | POA: Insufficient documentation

## 2016-05-24 DIAGNOSIS — I10 Essential (primary) hypertension: Secondary | ICD-10-CM | POA: Insufficient documentation

## 2016-05-24 DIAGNOSIS — Z8759 Personal history of other complications of pregnancy, childbirth and the puerperium: Secondary | ICD-10-CM

## 2016-05-24 DIAGNOSIS — Z331 Pregnant state, incidental: Secondary | ICD-10-CM

## 2016-05-24 DIAGNOSIS — Z3490 Encounter for supervision of normal pregnancy, unspecified, unspecified trimester: Secondary | ICD-10-CM | POA: Insufficient documentation

## 2016-05-24 LAB — POCT URINE PREGNANCY: Preg Test, Ur: POSITIVE — AB

## 2016-05-24 NOTE — Patient Instructions (Addendum)
Melissa Quinn was seen today for hypertension and possible pregnancy.  Diagnoses and all orders for this visit:  Currently pregnant -     POCT urine pregnancy -     Ambulatory referral to Obstetrics / Gynecology  Essential hypertension -     Ambulatory referral to Obstetrics / Gynecology  History of pre-eclampsia -     Ambulatory referral to Obstetrics / Gynecology   F/u in 4 weeks for BP check   Dr. Adrian Blackwater   First Trimester of Pregnancy The first trimester of pregnancy is from week 1 until the end of week 12 (months 1 through 3). A week after a sperm fertilizes an egg, the egg will implant on the wall of the uterus. This embryo will begin to develop into a baby. Genes from you and your partner are forming the baby. The female genes determine whether the baby is a boy or a girl. At 6-8 weeks, the eyes and face are formed, and the heartbeat can be seen on ultrasound. At the end of 12 weeks, all the baby's organs are formed.  Now that you are pregnant, you will want to do everything you can to have a healthy baby. Two of the most important things are to get good prenatal care and to follow your health care provider's instructions. Prenatal care is all the medical care you receive before the baby's birth. This care will help prevent, find, and treat any problems during the pregnancy and childbirth. BODY CHANGES Your body goes through many changes during pregnancy. The changes vary from woman to woman.   You may gain or lose a couple of pounds at first.  You may feel sick to your stomach (nauseous) and throw up (vomit). If the vomiting is uncontrollable, call your health care provider.  You may tire easily.  You may develop headaches that can be relieved by medicines approved by your health care provider.  You may urinate more often. Painful urination may mean you have a bladder infection.  You may develop heartburn as a result of your pregnancy.  You may develop constipation because certain  hormones are causing the muscles that push waste through your intestines to slow down.  You may develop hemorrhoids or swollen, bulging veins (varicose veins).  Your breasts may begin to grow larger and become tender. Your nipples may stick out more, and the tissue that surrounds them (areola) may become darker.  Your gums may bleed and may be sensitive to brushing and flossing.  Dark spots or blotches (chloasma, mask of pregnancy) may develop on your face. This will likely fade after the baby is born.  Your menstrual periods will stop.  You may have a loss of appetite.  You may develop cravings for certain kinds of food.  You may have changes in your emotions from day to day, such as being excited to be pregnant or being concerned that something may go wrong with the pregnancy and baby.  You may have more vivid and strange dreams.  You may have changes in your hair. These can include thickening of your hair, rapid growth, and changes in texture. Some women also have hair loss during or after pregnancy, or hair that feels dry or thin. Your hair will most likely return to normal after your baby is born. WHAT TO EXPECT AT YOUR PRENATAL VISITS During a routine prenatal visit:  You will be weighed to make sure you and the baby are growing normally.  Your blood pressure will be taken.  Your abdomen will be measured to track your baby's growth.  The fetal heartbeat will be listened to starting around week 10 or 12 of your pregnancy.  Test results from any previous visits will be discussed. Your health care provider may ask you:  How you are feeling.  If you are feeling the baby move.  If you have had any abnormal symptoms, such as leaking fluid, bleeding, severe headaches, or abdominal cramping.  If you are using any tobacco products, including cigarettes, chewing tobacco, and electronic cigarettes.  If you have any questions. Other tests that may be performed during your first  trimester include:  Blood tests to find your blood type and to check for the presence of any previous infections. They will also be used to check for low iron levels (anemia) and Rh antibodies. Later in the pregnancy, blood tests for diabetes will be done along with other tests if problems develop.  Urine tests to check for infections, diabetes, or protein in the urine.  An ultrasound to confirm the proper growth and development of the baby.  An amniocentesis to check for possible genetic problems.  Fetal screens for spina bifida and Down syndrome.  You may need other tests to make sure you and the baby are doing well.  HIV (human immunodeficiency virus) testing. Routine prenatal testing includes screening for HIV, unless you choose not to have this test. HOME CARE INSTRUCTIONS  Medicines  Follow your health care provider's instructions regarding medicine use. Specific medicines may be either safe or unsafe to take during pregnancy.  Take your prenatal vitamins as directed.  If you develop constipation, try taking a stool softener if your health care provider approves. Diet  Eat regular, well-balanced meals. Choose a variety of foods, such as meat or vegetable-based protein, fish, milk and low-fat dairy products, vegetables, fruits, and whole grain breads and cereals. Your health care provider will help you determine the amount of weight gain that is right for you.  Avoid raw meat and uncooked cheese. These carry germs that can cause birth defects in the baby.  Eating four or five small meals rather than three large meals a day may help relieve nausea and vomiting. If you start to feel nauseous, eating a few soda crackers can be helpful. Drinking liquids between meals instead of during meals also seems to help nausea and vomiting.  If you develop constipation, eat more high-fiber foods, such as fresh vegetables or fruit and whole grains. Drink enough fluids to keep your urine clear or  pale yellow. Activity and Exercise  Exercise only as directed by your health care provider. Exercising will help you:  Control your weight.  Stay in shape.  Be prepared for labor and delivery.  Experiencing pain or cramping in the lower abdomen or low back is a good sign that you should stop exercising. Check with your health care provider before continuing normal exercises.  Try to avoid standing for long periods of time. Move your legs often if you must stand in one place for a long time.  Avoid heavy lifting.  Wear low-heeled shoes, and practice good posture.  You may continue to have sex unless your health care provider directs you otherwise. Relief of Pain or Discomfort  Wear a good support bra for breast tenderness.   Take warm sitz baths to soothe any pain or discomfort caused by hemorrhoids. Use hemorrhoid cream if your health care provider approves.   Rest with your legs elevated if you have leg  cramps or low back pain.  If you develop varicose veins in your legs, wear support hose. Elevate your feet for 15 minutes, 3-4 times a day. Limit salt in your diet. Prenatal Care  Schedule your prenatal visits by the twelfth week of pregnancy. They are usually scheduled monthly at first, then more often in the last 2 months before delivery.  Write down your questions. Take them to your prenatal visits.  Keep all your prenatal visits as directed by your health care provider. Safety  Wear your seat belt at all times when driving.  Make a list of emergency phone numbers, including numbers for family, friends, the hospital, and police and fire departments. General Tips  Ask your health care provider for a referral to a local prenatal education class. Begin classes no later than at the beginning of month 6 of your pregnancy.  Ask for help if you have counseling or nutritional needs during pregnancy. Your health care provider can offer advice or refer you to specialists for  help with various needs.  Do not use hot tubs, steam rooms, or saunas.  Do not douche or use tampons or scented sanitary pads.  Do not cross your legs for long periods of time.  Avoid cat litter boxes and soil used by cats. These carry germs that can cause birth defects in the baby and possibly loss of the fetus by miscarriage or stillbirth.  Avoid all smoking, herbs, alcohol, and medicines not prescribed by your health care provider. Chemicals in these affect the formation and growth of the baby.  Do not use any tobacco products, including cigarettes, chewing tobacco, and electronic cigarettes. If you need help quitting, ask your health care provider. You may receive counseling support and other resources to help you quit.  Schedule a dentist appointment. At home, brush your teeth with a soft toothbrush and be gentle when you floss. SEEK MEDICAL CARE IF:   You have dizziness.  You have mild pelvic cramps, pelvic pressure, or nagging pain in the abdominal area.  You have persistent nausea, vomiting, or diarrhea.  You have a bad smelling vaginal discharge.  You have pain with urination.  You notice increased swelling in your face, hands, legs, or ankles. SEEK IMMEDIATE MEDICAL CARE IF:   You have a fever.  You are leaking fluid from your vagina.  You have spotting or bleeding from your vagina.  You have severe abdominal cramping or pain.  You have rapid weight gain or loss.  You vomit blood or material that looks like coffee grounds.  You are exposed to [place] measles and have never had them.  You are exposed to fifth disease or chickenpox.  You develop a severe headache.  You have shortness of breath.  You have any kind of trauma, such as from a fall or a car accident.   This information is not intended to replace advice given to you by your health care provider. Make sure you discuss any questions you have with your health care provider.   Document Released:  11/22/2001 Document Revised: 12/19/2014 Document Reviewed: 10/08/2013 Elsevier Interactive Patient Education [employer].

## 2016-05-24 NOTE — Progress Notes (Signed)
F/U HTN not taking medication  Positive home pregnancy test  No tobacco user

## 2016-05-24 NOTE — Assessment & Plan Note (Signed)
HTN hx, nomotensive today off losartan x 2 weeks. She is now pregnant.   Plan to continue to not take losartan Close monitoring of BP Plan to treat BP if she becomes HTN

## 2016-05-24 NOTE — Assessment & Plan Note (Signed)
Patient is currently pregnant with pre-eclampsia at end of last pregnancy Pregnancy verification letter written. Patient advise to take letter to social services office to apply for pregnancy medicaid.   OB referral placed, she will need to establish care at high risk OB clinic

## 2016-05-24 NOTE — Progress Notes (Signed)
Subjective:  Patient ID: Melissa Quinn, female    DOB: 10-11-1986  Age: 30 y.o. MRN: [id number]  CC: Hypertension and Possible Pregnancy   HPI Melissa Quinn presents for    1. HTN:  is not taking losartan for the past two weeks. No HA, CP, SOB or leg swelling.   2. Positive home pregnancy test: LMP 04/20/16. She has hx of pre-eclampsia in last pregnancy in 2014 at 33 weeks. She has a late pre-term delivery. This was when she first developed HTN. No vaginal bleeding.   OB History  Gravida Para Term Preterm AB SAB TAB Ectopic Multiple Living  5 4 2 2      4     # Outcome Date GA Lbr Len/2nd Weight Sex Delivery Anes PTL Lv  5 Preterm 10/24/13 [redacted]w[redacted]d  4 lb 3.7 oz (1.92 kg) F CS-LTranv Spinal  Y  4 Term 06/20/03    F Vag-Spont None  Y  3 Term 05/29/02    M Vag-Spont None  Y  2 Preterm 03/17/01    Jerilynn Mages Vag-Spont EPI  Y  1 Gravida               Social History  Substance Use Topics  . Smoking status: Never Smoker   . Smokeless tobacco: Never Used  . Alcohol Use: No    Outpatient Prescriptions Prior to Visit  Medication Sig Dispense Refill  . etonogestrel (NEXPLANON) 68 MG IMPL implant 1 each by Subdermal route once.    Marland Kitchen losartan (COZAAR) 50 MG tablet Take 1 tablet (50 mg total) by mouth daily. 30 tablet 5   No facility-administered medications prior to visit.    ROS Review of Systems  Constitutional: Negative for fever and chills.  Eyes: Negative for visual disturbance.  Respiratory: Negative for shortness of breath.   Cardiovascular: Negative for chest pain.  Gastrointestinal: Negative for abdominal pain and blood in stool.  Genitourinary: Negative for vaginal bleeding.  Musculoskeletal: Negative for back pain and arthralgias.  Skin: Negative for rash.  Allergic/Immunologic: Negative for immunocompromised state.  Hematological: Negative for adenopathy. Does not bruise/bleed easily.  Psychiatric/Behavioral: Negative for suicidal ideas and dysphoric mood.     Objective:  BP 123/77 mmHg  Pulse 90  Temp(Src) 98.8 F (37.1 C) (Oral)  Resp 16  Ht 5' (1.524 m)  Wt 154 lb (69.854 kg)  BMI 30.08 kg/m2  SpO2 99%  BP/Weight 05/24/2016 12/10/2015 [id number]  Systolic BP [id number]  Diastolic BP 77 89 88  Wt. (Lbs) 154 149 -  BMI 30.08 29.1 -   Physical Exam  Constitutional: She is oriented to person, place, and time. She appears well-developed and well-nourished. No distress.  HENT:  Head: Normocephalic and atraumatic.  Cardiovascular: Normal rate, regular rhythm, normal heart sounds and intact distal pulses.   Pulmonary/Chest: Effort normal and breath sounds normal.  Musculoskeletal: She exhibits no edema.  Neurological: She is alert and oriented to person, place, and time.  Skin: Skin is warm and dry. No rash noted.  Psychiatric: She has a normal mood and affect.   U preg positive   Assessment & Plan:   There are no diagnoses linked to this encounter. Melissa Quinn was seen today for hypertension and possible pregnancy.  Diagnoses and all orders for this visit:  Currently pregnant -     POCT urine pregnancy -     Ambulatory referral to Obstetrics / Gynecology  Essential hypertension -     Ambulatory referral to Obstetrics / Gynecology  History of pre-eclampsia -     Ambulatory referral to Obstetrics / Gynecology   No orders of the defined types were placed in this encounter.    Follow-up: Return in about 4 weeks (around 06/21/2016) for BP check .   Boykin Nearing MD

## 2016-05-24 NOTE — Addendum Note (Signed)
Addended by: Boykin Nearing on: 05/24/2016 08:58 PM   Modules accepted: Level of Service, SmartSet

## 2016-06-28 ENCOUNTER — Encounter: Payer: Self-pay | Admitting: Obstetrics & Gynecology

## 2016-06-28 ENCOUNTER — Ambulatory Visit (INDEPENDENT_AMBULATORY_CARE_PROVIDER_SITE_OTHER): Payer: Self-pay | Admitting: Obstetrics & Gynecology

## 2016-06-28 VITALS — BP 139/90 | HR 74 | Wt 150.0 lb

## 2016-06-28 DIAGNOSIS — O09291 Supervision of pregnancy with other poor reproductive or obstetric history, first trimester: Secondary | ICD-10-CM

## 2016-06-28 DIAGNOSIS — O0990 Supervision of high risk pregnancy, unspecified, unspecified trimester: Secondary | ICD-10-CM | POA: Insufficient documentation

## 2016-06-28 DIAGNOSIS — Z36 Encounter for antenatal screening of mother: Secondary | ICD-10-CM

## 2016-06-28 DIAGNOSIS — O10911 Unspecified pre-existing hypertension complicating pregnancy, first trimester: Secondary | ICD-10-CM

## 2016-06-28 DIAGNOSIS — Z113 Encounter for screening for infections with a predominantly sexual mode of transmission: Secondary | ICD-10-CM

## 2016-06-28 DIAGNOSIS — O34219 Maternal care for unspecified type scar from previous cesarean delivery: Secondary | ICD-10-CM

## 2016-06-28 DIAGNOSIS — O119 Pre-existing hypertension with pre-eclampsia, unspecified trimester: Secondary | ICD-10-CM | POA: Insufficient documentation

## 2016-06-28 DIAGNOSIS — Z1151 Encounter for screening for human papillomavirus (HPV): Secondary | ICD-10-CM

## 2016-06-28 DIAGNOSIS — O0991 Supervision of high risk pregnancy, unspecified, first trimester: Secondary | ICD-10-CM

## 2016-06-28 DIAGNOSIS — O10919 Unspecified pre-existing hypertension complicating pregnancy, unspecified trimester: Secondary | ICD-10-CM

## 2016-06-28 DIAGNOSIS — Z124 Encounter for screening for malignant neoplasm of cervix: Secondary | ICD-10-CM

## 2016-06-28 MED ORDER — ASPIRIN EC 81 MG PO TBEC: 81.0000 mg | DELAYED_RELEASE_TABLET | Freq: Every day | ORAL | Status: DC

## 2016-06-28 MED ORDER — DOXYLAMINE-PYRIDOXINE 10-10 MG PO TBEC: DELAYED_RELEASE_TABLET | ORAL | Status: DC

## 2016-06-28 MED ORDER — PROMETHAZINE HCL 25 MG PO TABS: 25.0000 mg | ORAL_TABLET | Freq: Four times a day (QID) | ORAL | Status: DC | PRN

## 2016-06-28 NOTE — Patient Instructions (Signed)

## 2016-06-28 NOTE — Progress Notes (Signed)
   Subjective: New OB    Melissa Quinn is a [id number] [redacted]w[redacted]d being seen today for her first obstetrical visit.  Her obstetrical history is significant for [facility] and previous severe preeclampsia. Patient does intend to breast feed. Pregnancy history fully reviewed.  Patient reports nausea.  Filed Vitals:   06/28/16 0906  BP: 139/90  Pulse: 74  Weight: 150 lb (68.04 kg)    HISTORY: OB History  Gravida Para Term Preterm AB SAB TAB Ectopic Multiple Living  5 4 2 2      4     # Outcome Date GA Lbr Len/2nd Weight Sex Delivery Anes PTL Lv  5 Current           4 Preterm 10/24/13 [redacted]w[redacted]d  4 lb 3.7 oz (1.92 kg) F CS-LTranv Spinal  Y  3 Term 06/20/03    F Vag-Spont None  Y  2 Term 05/29/02    M Vag-Spont None  Y  1 Preterm 03/17/01    M Vag-Spont EPI  Y     Past Medical History  Diagnosis Date  . Chlamydia 2011  . Abnormal Pap smear     colpo 2013  . Pregnancy induced hypertension 09/2013   Past Surgical History  Procedure Laterality Date  . No past surgeries    . Cesarean section N/A 10/23/2013    Procedure: Primary Cesarean Section Delivery Baby Girl @ 0004, Apgars 8/8;  Surgeon: Jonnie Kind, MD;  Location: [facility];  Service: Obstetrics;  Laterality: N/A;   Family History  Problem Relation Age of Onset  . Diabetes Mother   . Hypertension Mother   . Hypertension Sister   . Asthma Son   . Diabetes Maternal Grandmother      Exam    Uterus:     Pelvic Exam:    Perineum: No Hemorrhoids   Vulva: normal   Vagina:  normal mucosa   pH:     Cervix: no lesions   Adnexa: normal adnexa   Bony Pelvis: average  System: Breast:  normal appearance, no masses or tenderness   Skin: normal coloration and turgor, no rashes    Neurologic: oriented, normal mood   Extremities: normal strength, tone, and muscle mass   HEENT thyroid without masses and trachea midline   Mouth/Teeth mucous membranes moist, pharynx normal without lesions and dental hygiene good   Neck supple   Cardiovascular: regular rate and rhythm, no murmurs or gallops   Respiratory:  appears well, vitals normal, no respiratory distress, acyanotic, normal RR, neck free of mass or lymphadenopathy, chest clear, no wheezing, crepitations, rhonchi, normal symmetric air entry   Abdomen: soft, non-tender; bowel sounds normal; no masses,  no organomegaly   Urinary: urethral meatus normal      Assessment:    Pregnancy: [id number] Patient Active Problem List   Diagnosis Date Noted  . Supervision of high risk pregnancy, antepartum 06/28/2016  . Chronic hypertension during pregnancy, antepartum 06/28/2016  . Currently pregnant 05/24/2016  . HTN (hypertension) 12/10/2015        Plan:     Initial labs drawn. Prenatal vitamins. Problem list reviewed and updated. Genetic Screening discussed First Screen: or Panorama.  Ultrasound discussed; fetal survey: requested.  Follow up in 4 weeks. 50% of 30 min visit spent on counseling and coordination of care.  ASA 81 mg daily   Ellinor Test 06/28/2016

## 2016-06-28 NOTE — Progress Notes (Signed)
Initial prenatal visit - Last pap 2014 at [facility] - Hx of abnormal pap smear but last pap was normal. Current c/o nausea and vomiting all through out the day.  She desires Panorama at next visit.

## 2016-06-29 LAB — PRENATAL PROFILE (SOLSTAS)
Antibody Screen: NEGATIVE
BASOS ABS: 0 {cells}/uL (ref 0–200)
Basophils Relative: 0 %
EOS ABS: 190 {cells}/uL (ref 15–500)
Eosinophils Relative: 2 %
HCT: 39.5 % (ref 35.0–45.0)
HIV 1&2 Ab, 4th Generation: NONREACTIVE
Hemoglobin: 13.2 g/dL (ref 11.7–15.5)
Hepatitis B Surface Ag: NEGATIVE
LYMPHS PCT: 30 %
Lymphs Abs: 2850 cells/uL (ref 850–3900)
MCH: 30.8 pg (ref 27.0–33.0)
MCHC: 33.4 g/dL (ref 32.0–36.0)
MCV: 92.1 fL (ref 80.0–100.0)
MONOS PCT: 6 %
MPV: 10.3 fL (ref 7.5–12.5)
Monocytes Absolute: 570 cells/uL (ref 200–950)
Neutro Abs: 5890 cells/uL (ref 1500–7800)
Neutrophils Relative %: 62 %
PLATELETS: 342 10*3/uL (ref 140–400)
RBC: 4.29 MIL/uL (ref 3.80–5.10)
RDW: 14.2 % (ref 11.0–15.0)
RH TYPE: POSITIVE
RUBELLA: 20.8 {index} — AB (ref <0.90)
WBC: 9.5 10*3/uL (ref 3.8–10.8)

## 2016-06-29 LAB — WET PREP BY MOLECULAR PROBE
Candida species: NEGATIVE
Gardnerella vaginalis: NEGATIVE
Trichomonas vaginosis: NEGATIVE

## 2016-06-29 LAB — PROTEIN / CREATININE RATIO, URINE
Creatinine, Urine: 191 mg/dL (ref 20–320)
PROTEIN CREATININE RATIO: 79 mg/g{creat} (ref 21–161)
Total Protein, Urine: 15 mg/dL (ref 5–24)

## 2016-06-29 LAB — COMPREHENSIVE METABOLIC PANEL
ALBUMIN: 4.2 g/dL (ref 3.6–5.1)
ALK PHOS: 62 U/L (ref 33–115)
ALT: 22 U/L (ref 6–29)
AST: 19 U/L (ref 10–30)
BUN: 8 mg/dL (ref 7–25)
CO2: 20 mmol/L (ref 20–31)
CREATININE: 0.55 mg/dL (ref 0.50–1.10)
Calcium: 9.2 mg/dL (ref 8.6–10.2)
Chloride: 107 mmol/L (ref 98–110)
Glucose, Bld: 88 mg/dL (ref 65–99)
Potassium: 4.3 mmol/L (ref 3.5–5.3)
Sodium: 140 mmol/L (ref 135–146)
TOTAL PROTEIN: 7.1 g/dL (ref 6.1–8.1)
Total Bilirubin: 0.3 mg/dL (ref 0.2–1.2)

## 2016-06-29 LAB — GC/CHLAMYDIA PROBE AMP (~~LOC~~) NOT AT ARMC
CHLAMYDIA, DNA PROBE: NEGATIVE
NEISSERIA GONORRHEA: NEGATIVE

## 2016-06-29 LAB — CYTOLOGY - PAP

## 2016-06-30 LAB — HEMOGLOBINOPATHY EVALUATION
HCT: 39.5 % (ref 35.0–45.0)
HGB A2 QUANT: 2.2 % (ref 1.8–3.5)
Hemoglobin: 13.2 g/dL (ref 11.7–15.5)
Hgb A: 96.8 % (ref >96.0)
Hgb F Quant: <1 % (ref <2.0)
MCH: 30.8 pg (ref 27.0–33.0)
MCV: 92.1 fL (ref 80.0–100.0)
RBC: 4.29 MIL/uL (ref 3.80–5.10)
RDW: 14.2 % (ref 11.0–15.0)

## 2016-06-30 LAB — PAIN MGMT, PROFILE 6 CONF W/O MM, U
6 Acetylmorphine: NEGATIVE ng/mL (ref <10)
AMPHETAMINES: NEGATIVE ng/mL (ref <500)
Alcohol Metabolites: NEGATIVE ng/mL (ref <500)
BARBITURATES: NEGATIVE ng/mL (ref <300)
Benzodiazepines: NEGATIVE ng/mL (ref <100)
Cocaine Metabolite: NEGATIVE ng/mL (ref <150)
Creatinine: 164.4 mg/dL (ref >=20.0)
Marijuana Metabolite: NEGATIVE ng/mL (ref <20)
Methadone Metabolite: NEGATIVE ng/mL (ref <100)
OXIDANT: NEGATIVE ug/mL (ref <200)
Opiates: NEGATIVE ng/mL (ref <100)
Oxycodone: NEGATIVE ng/mL (ref <100)
Phencyclidine: NEGATIVE ng/mL (ref <25)
Please note:: 0
pH: 6.38 (ref 4.5–9.0)

## 2016-06-30 LAB — CULTURE, OB URINE
Colony Count: NO GROWTH
ORGANISM ID, BACTERIA: NO GROWTH

## 2016-07-13 MED FILL — PROMETHAZINE 25 MG TABLET: 25 | 30 days supply | Qty: 30 | Fill #0

## 2016-07-26 ENCOUNTER — Ambulatory Visit (INDEPENDENT_AMBULATORY_CARE_PROVIDER_SITE_OTHER): Payer: Self-pay | Admitting: Obstetrics & Gynecology

## 2016-07-26 VITALS — BP 128/88 | HR 83 | Wt 148.0 lb

## 2016-07-26 DIAGNOSIS — Z36 Encounter for antenatal screening of mother: Secondary | ICD-10-CM

## 2016-07-26 DIAGNOSIS — Z3689 Encounter for other specified antenatal screening: Secondary | ICD-10-CM

## 2016-07-26 DIAGNOSIS — O10919 Unspecified pre-existing hypertension complicating pregnancy, unspecified trimester: Secondary | ICD-10-CM

## 2016-07-26 DIAGNOSIS — O10912 Unspecified pre-existing hypertension complicating pregnancy, second trimester: Secondary | ICD-10-CM

## 2016-07-26 DIAGNOSIS — O0992 Supervision of high risk pregnancy, unspecified, second trimester: Secondary | ICD-10-CM

## 2016-07-26 NOTE — Progress Notes (Signed)
Subjective:  Melissa Quinn is a 30 y.o. [phone] at [redacted]w[redacted]d being seen today for ongoing prenatal care.  She is currently monitored for the following issues for this low-risk pregnancy and has HTN (hypertension); Supervision of high risk pregnancy, antepartum; Chronic hypertension during pregnancy, antepartum; and Previous cesarean delivery affecting pregnancy, antepartum on her problem list.  Patient reports no complaints.  Contractions: Not present. Vag. Bleeding: None.  Movement: Absent. Denies leaking of fluid.   The following portions of the patient's history were reviewed and updated as appropriate: allergies, current medications, past family history, past medical history, past social history, past surgical history and problem list. Problem list updated.  Objective:   Vitals:   07/26/16 0921  BP: 128/88  Pulse: 83  Weight: 148 lb (67.1 kg)    Fetal Status: Fetal Heart Rate (bpm): 156   Movement: Absent     General:  Alert, oriented and cooperative. Patient is in no acute distress.  Skin: Skin is warm and dry. No rash noted.   Cardiovascular: Normal heart rate noted  Respiratory: Normal respiratory effort, no problems with respiration noted  Abdomen: Soft, gravid, appropriate for gestational age. Pain/Pressure: Present     Pelvic:  Cervical exam deferred        Extremities: Normal range of motion.  Edema: None  Mental Status: Normal mood and affect. Normal behavior. Normal judgment and thought content.   Urinalysis: Urine Protein: Trace Urine Glucose: Negative  Assessment and Plan:  Pregnancy: [id number] at [redacted]w[redacted]d  1. Chronic hypertension during pregnancy, antepartum Stable BP.  Continue ASA. - [place] MFM OB COMP + 14 WK; Future  2. Encounter for fetal anatomic survey Anatomy scan ordered - US MFM OB COMP + 14 WK; Future  3. Supervision of high risk pregnancy, antepartum, second trimester Panorama done today - Genetic Screening No other complaints or concerns.  Routine  obstetric precautions reviewed. Please refer to After Visit Summary for other counseling recommendations.  Return in about 4 weeks (around 08/23/2016) for OB Visit.   Osborne Oman, MD

## 2016-07-26 NOTE — Patient Instructions (Signed)

## 2016-08-23 ENCOUNTER — Ambulatory Visit (INDEPENDENT_AMBULATORY_CARE_PROVIDER_SITE_OTHER): Payer: Self-pay | Admitting: Obstetrics & Gynecology

## 2016-08-23 VITALS — BP 127/82 | HR 80 | Wt 148.0 lb

## 2016-08-23 DIAGNOSIS — O0991 Supervision of high risk pregnancy, unspecified, first trimester: Secondary | ICD-10-CM

## 2016-08-23 DIAGNOSIS — Z23 Encounter for immunization: Secondary | ICD-10-CM

## 2016-08-23 DIAGNOSIS — O10919 Unspecified pre-existing hypertension complicating pregnancy, unspecified trimester: Secondary | ICD-10-CM

## 2016-08-23 DIAGNOSIS — O10912 Unspecified pre-existing hypertension complicating pregnancy, second trimester: Secondary | ICD-10-CM

## 2016-08-23 NOTE — Progress Notes (Signed)
   PRENATAL VISIT NOTE  Subjective:  Melissa Quinn is a 30 y.o. [phone] at [redacted]w[redacted]d being seen today for ongoing prenatal care.  She is currently monitored for the following issues for this high-risk pregnancy and has HTN (hypertension); Supervision of high risk pregnancy, antepartum; Chronic hypertension during pregnancy, antepartum; and Previous cesarean delivery affecting pregnancy, antepartum on her problem list.  Patient reports no complaints.  Contractions: Not present. Vag. Bleeding: None.  Movement: Present. Denies leaking of fluid.   The following portions of the patient's history were reviewed and updated as appropriate: allergies, current medications, past family history, past medical history, past social history, past surgical history and problem list. Problem list updated.  Objective:   Vitals:   08/23/16 0857  BP: 127/82  Pulse: 80  Weight: 148 lb (67.1 kg)    Fetal Status: Fetal Heart Rate (bpm): 142   Movement: Present     General:  Alert, oriented and cooperative. Patient is in no acute distress.  Skin: Skin is warm and dry. No rash noted.   Cardiovascular: Normal heart rate noted  Respiratory: Normal respiratory effort, no problems with respiration noted  Abdomen: Soft, gravid, appropriate for gestational age. Pain/Pressure: Present     Pelvic:  Cervical exam deferred        Extremities: Normal range of motion.  Edema: None  Mental Status: Normal mood and affect. Normal behavior. Normal judgment and thought content.   Urinalysis: Urine Protein: Trace Urine Glucose: Negative  Assessment and Plan:  Pregnancy: [id number] at [redacted]w[redacted]d  1. Flu vaccine need - Flu Vaccine QUAD 36+ mos IM  2. Chronic hypertension during pregnancy, antepartum Stable BP. Continue ASA.    3. Supervision of high risk pregnancy, antepartum, first trimester Scheduled for anatomy scan already on 08/25/16.   No other complaints or concerns.  Routine obstetric precautions reviewed. Please refer to  After Visit Summary for other counseling recommendations.  Return in about 4 weeks (around 09/20/2016) for OB Visit.  Osborne Oman, MD

## 2016-09-22 ENCOUNTER — Other Ambulatory Visit: Payer: Self-pay | Admitting: *Deleted

## 2016-09-22 ENCOUNTER — Encounter: Payer: Self-pay | Admitting: Obstetrics and Gynecology

## 2016-09-22 ENCOUNTER — Ambulatory Visit (INDEPENDENT_AMBULATORY_CARE_PROVIDER_SITE_OTHER): Payer: Self-pay | Admitting: Obstetrics and Gynecology

## 2016-09-22 VITALS — BP 123/78 | HR 80 | Wt 150.0 lb

## 2016-09-22 DIAGNOSIS — O0990 Supervision of high risk pregnancy, unspecified, unspecified trimester: Secondary | ICD-10-CM

## 2016-09-22 DIAGNOSIS — Z8759 Personal history of other complications of pregnancy, childbirth and the puerperium: Secondary | ICD-10-CM | POA: Insufficient documentation

## 2016-09-22 DIAGNOSIS — O10912 Unspecified pre-existing hypertension complicating pregnancy, second trimester: Secondary | ICD-10-CM

## 2016-09-22 DIAGNOSIS — O34219 Maternal care for unspecified type scar from previous cesarean delivery: Secondary | ICD-10-CM

## 2016-09-22 DIAGNOSIS — O10919 Unspecified pre-existing hypertension complicating pregnancy, unspecified trimester: Secondary | ICD-10-CM

## 2016-09-22 NOTE — Progress Notes (Signed)
Prenatal Visit Note Date: 09/22/2016 Clinic: [facility]  Subjective:  Melissa Quinn is a 30 y.o. [phone] at [redacted]w[redacted]d being seen today for ongoing prenatal care.  She is currently monitored for the following issues for this high-risk pregnancy and has Supervision of high risk pregnancy, antepartum; Chronic hypertension during pregnancy, antepartum; Previous cesarean delivery affecting pregnancy, antepartum; and History of severe pre-eclampsia on her problem list.  Patient reports no complaints.   Contractions: Not present. Vag. Bleeding: None.  Movement: Present. Denies leaking of fluid.   The following portions of the patient's history were reviewed and updated as appropriate: allergies, current medications, past family history, past medical history, past social history, past surgical history and problem list. Problem list updated.  Objective:   Vitals:   09/22/16 0909  BP: 123/78  Pulse: 80  Weight: 150 lb (68 kg)    Fetal Status: Fetal Heart Rate (bpm): 153   Movement: Present     General:  Alert, oriented and cooperative. Patient is in no acute distress.  Skin: Skin is warm and dry. No rash noted.   Cardiovascular: Normal heart rate noted  Respiratory: Normal respiratory effort, no problems with respiration noted  Abdomen: Soft, gravid, appropriate for gestational age. Pain/Pressure: Absent     Pelvic:  Cervical exam deferred        Extremities: Normal range of motion.  Edema: None  Mental Status: Normal mood and affect. Normal behavior. Normal judgment and thought content.   Urinalysis: Urine Protein: Negative Urine Glucose: Negative  Assessment and Plan:  Pregnancy: [id number] at [redacted]w[redacted]d  *Pregnancy: pt had u/s at the [facility] on 9/14 but no results faxed to [place] and patient states she wasn't told any results. [facility] called to try and get results faxed to [place].  *cHTN: will schedule growth in 2-3wks -pt confirms baby ASA *h/o c-section: d/w her later in pregnancy. H/o  SVDs before primary c-section  Preterm labor symptoms and general obstetric precautions including but not limited to vaginal bleeding, contractions, leaking of fluid and fetal movement were reviewed in detail with the patient. Please refer to After Visit Summary for other counseling recommendations.  RTC 2wks   Aletha Halim, MD

## 2016-09-22 NOTE — Progress Notes (Signed)
Pt had one episode of light spotting for a day last week, has resolved since then.

## 2016-10-05 ENCOUNTER — Ambulatory Visit (INDEPENDENT_AMBULATORY_CARE_PROVIDER_SITE_OTHER): Payer: Self-pay | Admitting: Obstetrics and Gynecology

## 2016-10-05 VITALS — BP 124/80 | HR 99 | Wt 149.0 lb

## 2016-10-05 DIAGNOSIS — O10919 Unspecified pre-existing hypertension complicating pregnancy, unspecified trimester: Secondary | ICD-10-CM

## 2016-10-05 DIAGNOSIS — O10912 Unspecified pre-existing hypertension complicating pregnancy, second trimester: Secondary | ICD-10-CM

## 2016-10-05 DIAGNOSIS — O0990 Supervision of high risk pregnancy, unspecified, unspecified trimester: Secondary | ICD-10-CM

## 2016-10-05 DIAGNOSIS — Z8759 Personal history of other complications of pregnancy, childbirth and the puerperium: Secondary | ICD-10-CM

## 2016-10-05 DIAGNOSIS — O34219 Maternal care for unspecified type scar from previous cesarean delivery: Secondary | ICD-10-CM

## 2016-10-05 NOTE — Progress Notes (Signed)
Prenatal Visit Note Date: 10/05/2016 Clinic: [facility]  Subjective:  Melissa Quinn is a 30 y.o. [phone] at [redacted]w[redacted]d being seen today for ongoing prenatal care.  She is currently monitored for the following issues for this high-risk pregnancy and has Supervision of high risk pregnancy, antepartum; Chronic hypertension during pregnancy, antepartum; Previous cesarean delivery affecting pregnancy, antepartum; and History of severe pre-eclampsia on her problem list.  Patient reports no complaints.   Contractions: Not present. Vag. Bleeding: None.  Movement: Present. Denies leaking of fluid.   The following portions of the patient's history were reviewed and updated as appropriate: allergies, current medications, past family history, past medical history, past social history, past surgical history and problem list. Problem list updated.  Objective:   Vitals:   10/05/16 1100  BP: 124/80  Pulse: 99  Weight: 149 lb (67.6 kg)    Fetal Status: Fetal Heart Rate (bpm): 146   Movement: Present     General:  Alert, oriented and cooperative. Patient is in no acute distress.  Skin: Skin is warm and dry. No rash noted.   Cardiovascular: Normal heart rate noted  Respiratory: Normal respiratory effort, no problems with respiration noted  Abdomen: Soft, gravid, appropriate for gestational age. Pain/Pressure: Absent     Pelvic:  Cervical exam deferred        Extremities: Normal range of motion.  Edema: None  Mental Status: Normal mood and affect. Normal behavior. Normal judgment and thought content.   Urinalysis:      Assessment and Plan:  Pregnancy: [id number] at [redacted]w[redacted]d  1. Supervision of high risk pregnancy, antepartum Routine care. 28wk labs nv  2. Chronic hypertension during pregnancy, antepartum Continue ASA 81mg . Pt currently on no medications. Growth scan tomorrow and f/u rpt in 4wks.   3. Previous cesarean delivery affecting pregnancy, antepartum D/w her re: delivery  mode later in pregnancy  Preterm labor symptoms and general obstetric precautions including but not limited to vaginal bleeding, contractions, leaking of fluid and fetal movement were reviewed in detail with the patient. Please refer to After Visit Summary for other counseling recommendations.  Return in about 4 weeks (around 11/02/2016).   Aletha Halim, MD

## 2016-10-06 ENCOUNTER — Encounter: Payer: Self-pay | Admitting: Obstetrics and Gynecology

## 2016-10-10 ENCOUNTER — Encounter: Payer: Self-pay | Admitting: *Deleted

## 2016-11-02 ENCOUNTER — Ambulatory Visit (INDEPENDENT_AMBULATORY_CARE_PROVIDER_SITE_OTHER): Payer: Self-pay | Admitting: Family Medicine

## 2016-11-02 ENCOUNTER — Encounter: Payer: Self-pay | Admitting: *Deleted

## 2016-11-02 VITALS — BP 123/85 | HR 82 | Wt 154.0 lb

## 2016-11-02 DIAGNOSIS — O34219 Maternal care for unspecified type scar from previous cesarean delivery: Secondary | ICD-10-CM

## 2016-11-02 DIAGNOSIS — Z23 Encounter for immunization: Secondary | ICD-10-CM

## 2016-11-02 DIAGNOSIS — O0990 Supervision of high risk pregnancy, unspecified, unspecified trimester: Secondary | ICD-10-CM

## 2016-11-02 DIAGNOSIS — O10913 Unspecified pre-existing hypertension complicating pregnancy, third trimester: Secondary | ICD-10-CM

## 2016-11-02 DIAGNOSIS — O10919 Unspecified pre-existing hypertension complicating pregnancy, unspecified trimester: Secondary | ICD-10-CM

## 2016-11-02 LAB — CBC
HEMATOCRIT: 36 % (ref 35.0–45.0)
Hemoglobin: 11.8 g/dL (ref 11.7–15.5)
MCH: 29.9 pg (ref 27.0–33.0)
MCHC: 32.8 g/dL (ref 32.0–36.0)
MCV: 91.4 fL (ref 80.0–100.0)
MPV: 10.2 fL (ref 7.5–12.5)
Platelets: 324 10*3/uL (ref 140–400)
RBC: 3.94 MIL/uL (ref 3.80–5.10)
RDW: 13.8 % (ref 11.0–15.0)
WBC: 8.4 10*3/uL (ref 3.8–10.8)

## 2016-11-02 NOTE — Progress Notes (Signed)
[place] follow-up scheduled for 11/14/16 and 12/07/16

## 2016-11-02 NOTE — Patient Instructions (Signed)
Third Trimester of Pregnancy The third trimester is from week 29 through week 40 (months 7 through 9). The third trimester is a time when the unborn baby (fetus) is growing rapidly. At the end of the ninth month, the fetus is about 20 inches in length and weighs 6-10 pounds. Body changes during your third trimester Your body goes through many changes during pregnancy. The changes vary from woman to woman. During the third trimester:  Your weight will continue to increase. You can expect to gain 25-35 pounds (11-16 kg) by the end of the pregnancy.  You may begin to get stretch marks on your hips, abdomen, and breasts.  You may urinate more often because the fetus is moving lower into your pelvis and pressing on your bladder.  You may develop or continue to have heartburn. This is caused by increased hormones that slow down muscles in the digestive tract.  You may develop or continue to have constipation because increased hormones slow digestion and cause the muscles that push waste through your intestines to relax.  You may develop hemorrhoids. These are swollen veins (varicose veins) in the rectum that can itch or be painful.  You may develop swollen, bulging veins (varicose veins) in your legs.  You may have increased body aches in the pelvis, back, or thighs. This is due to weight gain and increased hormones that are relaxing your joints.  You may have changes in your hair. These can include thickening of your hair, rapid growth, and changes in texture. Some women also have hair loss during or after pregnancy, or hair that feels dry or thin. Your hair will most likely return to normal after your baby is born.  Your breasts will continue to grow and they will continue to become tender. A yellow fluid (colostrum) may leak from your breasts. This is the first milk you are producing for your baby.  Your belly button may stick out.  You may notice more swelling in your hands, face, or  ankles.  You may have increased tingling or numbness in your hands, arms, and legs. The skin on your belly may also feel numb.  You may feel short of breath because of your expanding uterus.  You may have more problems sleeping. This can be caused by the size of your belly, increased need to urinate, and an increase in your body's metabolism.  You may notice the fetus "dropping," or moving lower in your abdomen.  You may have increased vaginal discharge.  Your cervix becomes thin and soft (effaced) near your due date. What to expect at prenatal visits You will have prenatal exams every 2 weeks until week 36. Then you will have weekly prenatal exams. During a routine prenatal visit:  You will be weighed to make sure you and the fetus are growing normally.  Your blood pressure will be taken.  Your abdomen will be measured to track your baby's growth.  The fetal heartbeat will be listened to.  Any test results from the previous visit will be discussed.  You may have a cervical check near your due date to see if you have effaced. At around 36 weeks, your health care provider will check your cervix. At the same time, your health care provider will also perform a test on the secretions of the vaginal tissue. This test is to determine if a type of bacteria, Group B streptococcus, is present. Your health care provider will explain this further. Your health care provider may ask you:    What your birth plan is.  How you are feeling.  If you are feeling the baby move.  If you have had any abnormal symptoms, such as leaking fluid, bleeding, severe headaches, or abdominal cramping.  If you are using any tobacco products, including cigarettes, chewing tobacco, and electronic cigarettes.  If you have any questions. Other tests or screenings that may be performed during your third trimester include:  Blood tests that check for low iron levels (anemia).  Fetal testing to check the health,  activity level, and growth of the fetus. Testing is done if you have certain medical conditions or if there are problems during the pregnancy.  Nonstress test (NST). This test checks the health of your baby to make sure there are no signs of problems, such as the baby not getting enough oxygen. During this test, a belt is placed around your belly. The baby is made to move, and its heart rate is monitored during movement. What is false labor? False labor is a condition in which you feel small, irregular tightenings of the muscles in the womb (contractions) that eventually go away. These are called Braxton Hicks contractions. Contractions may last for hours, days, or even weeks before true labor sets in. If contractions come at regular intervals, become more frequent, increase in intensity, or become painful, you should see your health care provider. What are the signs of labor?  Abdominal cramps.  Regular contractions that start at 10 minutes apart and become stronger and more frequent with time.  Contractions that start on the top of the uterus and spread down to the lower abdomen and back.  Increased pelvic pressure and dull back pain.  A watery or bloody mucus discharge that comes from the vagina.  Leaking of amniotic fluid. This is also known as your "water breaking." It could be a slow trickle or a gush. Let your doctor know if it has a color or strange odor. If you have any of these signs, call your health care provider right away, even if it is before your due date. Follow these instructions at home: Eating and drinking  Continue to eat regular, healthy meals.  Do not eat:  Raw meat or meat spreads.  Unpasteurized milk or cheese.  Unpasteurized juice.  Store-made salad.  Refrigerated smoked seafood.  Hot dogs or deli meat, unless they are piping hot.  More than 6 ounces of albacore tuna a week.  Shark, swordfish, king mackerel, or tile fish.  Store-made salads.  Raw  sprouts, such as mung bean or alfalfa sprouts.  Take prenatal vitamins as told by your health care provider.  Take 1000 mg of calcium daily as told by your health care provider.  If you develop constipation:  Take over-the-counter or prescription medicines.  Drink enough fluid to keep your urine clear or pale yellow.  Eat foods that are high in fiber, such as fresh fruits and vegetables, whole grains, and beans.  Limit foods that are high in fat and processed sugars, such as fried and sweet foods. Activity  Exercise only as directed by your health care provider. Healthy pregnant women should aim for 2 hours and 30 minutes of moderate exercise per week. If you experience any pain or discomfort while exercising, stop.  Avoid heavy lifting.  Do not exercise in extreme heat or humidity, or at high altitudes.  Wear low-heel, comfortable shoes.  Practice good posture.  Do not travel far distances unless it is absolutely necessary and only with the approval   of your health care provider.  Wear your seat belt at all times while in a car, on a bus, or on a plane.  Take frequent breaks and rest with your legs elevated if you have leg cramps or low back pain.  Do not use hot tubs, steam rooms, or saunas.  You may continue to have sex unless your health care provider tells you otherwise. Lifestyle  Do not use any products that contain nicotine or tobacco, such as cigarettes and e-cigarettes. If you need help quitting, ask your health care provider.  Do not drink alcohol.  Do not use any medicinal herbs or unprescribed drugs. These chemicals affect the formation and growth of the baby.  If you develop varicose veins:  Wear support pantyhose or compression stockings as told by your healthcare provider.  Elevate your feet for 15 minutes, 3-4 times a day.  Wear a supportive maternity bra to help with breast tenderness. General instructions  Take over-the-counter and prescription  medicines only as told by your health care provider. There are medicines that are either safe or unsafe to take during pregnancy.  Take warm sitz baths to soothe any pain or discomfort caused by hemorrhoids. Use hemorrhoid cream or witch hazel if your health care provider approves.  Avoid cat litter boxes and soil used by cats. These carry germs that can cause birth defects in the baby. If you have a cat, ask someone to clean the litter box for you.  To prepare for the arrival of your baby:  Take prenatal classes to understand, practice, and ask questions about the labor and delivery.  Make a trial run to the hospital.  Visit the hospital and tour the maternity area.  Arrange for maternity or paternity leave through employers.  Arrange for family and friends to take care of pets while you are in the hospital.  Purchase a rear-facing car seat and make sure you know how to install it in your car.  Pack your hospital bag.  Prepare the baby's nursery. Make sure to remove all pillows and stuffed animals from the baby's crib to prevent suffocation.  Visit your dentist if you have not gone during your pregnancy. Use a soft toothbrush to brush your teeth and be gentle when you floss.  Keep all prenatal follow-up visits as told by your health care provider. This is important. Contact a health care provider if:  You are unsure if you are in labor or if your water has broken.  You become dizzy.  You have mild pelvic cramps, pelvic pressure, or nagging pain in your abdominal area.  You have lower back pain.  You have persistent nausea, vomiting, or diarrhea.  You have an unusual or bad smelling vaginal discharge.  You have pain when you urinate. Get help right away if:  You have a fever.  You are leaking fluid from your vagina.  You have spotting or bleeding from your vagina.  You have severe abdominal pain or cramping.  You have rapid weight loss or weight gain.  You have  shortness of breath with chest pain.  You notice sudden or extreme swelling of your face, hands, ankles, feet, or legs.  Your baby makes fewer than 10 movements in 2 hours.  You have severe headaches that do not go away with medicine.  You have vision changes. Summary  The third trimester is from week 29 through week 40, months 7 through 9. The third trimester is a time when the unborn baby (fetus)   is growing rapidly.  During the third trimester, your discomfort may increase as you and your baby continue to gain weight. You may have abdominal, leg, and back pain, sleeping problems, and an increased need to urinate.  During the third trimester your breasts will keep growing and they will continue to become tender. A yellow fluid (colostrum) may leak from your breasts. This is the first milk you are producing for your baby.  False labor is a condition in which you feel small, irregular tightenings of the muscles in the womb (contractions) that eventually go away. These are called Braxton Hicks contractions. Contractions may last for hours, days, or even weeks before true labor sets in.  Signs of labor can include: abdominal cramps; regular contractions that start at 10 minutes apart and become stronger and more frequent with time; watery or bloody mucus discharge that comes from the vagina; increased pelvic pressure and dull back pain; and leaking of amniotic fluid. This information is not intended to replace advice given to you by your health care provider. Make sure you discuss any questions you have with your health care provider. Document Released: 11/22/2001 Document Revised: 05/05/2016 Document Reviewed: 01/29/2013 [employer] Interactive Patient Education  2017 [employer]   Breastfeeding Deciding to breastfeed is one of the best choices you can make for you and your baby. A change in hormones during pregnancy causes your breast tissue to grow and increases the number and size of your  milk ducts. These hormones also allow proteins, sugars, and fats from your blood supply to make breast milk in your milk-producing glands. Hormones prevent breast milk from being released before your baby is born as well as prompt milk flow after birth. Once breastfeeding has begun, thoughts of your baby, as well as his or her sucking or crying, can stimulate the release of milk from your milk-producing glands. Benefits of breastfeeding For Your Baby  Your first milk (colostrum) helps your baby's digestive system function better.  There are antibodies in your milk that help your baby fight off infections.  Your baby has a lower incidence of asthma, allergies, and sudden infant death syndrome.  The nutrients in breast milk are better for your baby than infant formulas and are designed uniquely for your baby's needs.  Breast milk improves your baby's brain development.  Your baby is less likely to develop other conditions, such as childhood obesity, asthma, or type 2 diabetes mellitus. For You  Breastfeeding helps to create a very special bond between you and your baby.  Breastfeeding is convenient. Breast milk is always available at the correct temperature and costs nothing.  Breastfeeding helps to burn calories and helps you lose the weight gained during pregnancy.  Breastfeeding makes your uterus contract to its prepregnancy size faster and slows bleeding (lochia) after you give birth.  Breastfeeding helps to lower your risk of developing type 2 diabetes mellitus, osteoporosis, and breast or ovarian cancer later in life. Signs that your baby is hungry Early Signs of Hunger  Increased alertness or activity.  Stretching.  Movement of the head from side to side.  Movement of the head and opening of the mouth when the corner of the mouth or cheek is stroked (rooting).  Increased sucking sounds, smacking lips, cooing, sighing, or squeaking.  Hand-to-mouth movements.  Increased  sucking of fingers or hands. Late Signs of Hunger  Fussing.  Intermittent crying. Extreme Signs of Hunger  Signs of extreme hunger will require calming and consoling before your baby will   be able to breastfeed successfully. Do not wait for the following signs of extreme hunger to occur before you initiate breastfeeding:  Restlessness.  A loud, strong cry.  Screaming. Breastfeeding basics  Breastfeeding Initiation  Find a comfortable place to sit or lie down, with your neck and back well supported.  Place a pillow or rolled up blanket under your baby to bring him or her to the level of your breast (if you are seated). Nursing pillows are specially designed to help support your arms and your baby while you breastfeed.  Make sure that your baby's abdomen is facing your abdomen.  Gently massage your breast. With your fingertips, massage from your chest wall toward your nipple in a circular motion. This encourages milk flow. You may need to continue this action during the feeding if your milk flows slowly.  Support your breast with 4 fingers underneath and your thumb above your nipple. Make sure your fingers are well away from your nipple and your baby's mouth.  Stroke your baby's lips gently with your finger or nipple.  When your baby's mouth is open wide enough, quickly bring your baby to your breast, placing your entire nipple and as much of the colored area around your nipple (areola) as possible into your baby's mouth.  More areola should be visible above your baby's upper lip than below the lower lip.  Your baby's tongue should be between his or her lower gum and your breast.  Ensure that your baby's mouth is correctly positioned around your nipple (latched). Your baby's lips should create a seal on your breast and be turned out (everted).  It is common for your baby to suck about 2-3 minutes in order to start the flow of breast milk. Latching  Teaching your baby how to latch  on to your breast properly is very important. An improper latch can cause nipple pain and decreased milk supply for you and poor weight gain in your baby. Also, if your baby is not latched onto your nipple properly, he or she may swallow some air during feeding. This can make your baby fussy. Burping your baby when you switch breasts during the feeding can help to get rid of the air. However, teaching your baby to latch on properly is still the best way to prevent fussiness from swallowing air while breastfeeding. Signs that your baby has successfully latched on to your nipple:  Silent tugging or silent sucking, without causing you pain.  Swallowing heard between every 3-4 sucks.  Muscle movement above and in front of his or her ears while sucking. Signs that your baby has not successfully latched on to nipple:  Sucking sounds or smacking sounds from your baby while breastfeeding.  Nipple pain. If you think your baby has not latched on correctly, slip your finger into the corner of your baby's mouth to break the suction and place it between your baby's gums. Attempt breastfeeding initiation again. Signs of Successful Breastfeeding  Signs from your baby:  A gradual decrease in the number of sucks or complete cessation of sucking.  Falling asleep.  Relaxation of his or her body.  Retention of a small amount of milk in his or her mouth.  Letting go of your breast by himself or herself. Signs from you:  Breasts that have increased in firmness, weight, and size 1-3 hours after feeding.  Breasts that are softer immediately after breastfeeding.  Increased milk volume, as well as a change in milk consistency and color by   the fifth day of breastfeeding.  Nipples that are not sore, cracked, or bleeding. Signs That Your Baby is Getting Enough Milk  Wetting at least 1-2 diapers during the first 24 hours after birth.  Wetting at least 5-6 diapers every 24 hours for the first week after  birth. The urine should be clear or pale yellow by 5 days after birth.  Wetting 6-8 diapers every 24 hours as your baby continues to grow and develop.  At least 3 stools in a 24-hour period by age 5 days. The stool should be soft and yellow.  At least 3 stools in a 24-hour period by age 7 days. The stool should be seedy and yellow.  No loss of weight greater than 10% of birth weight during the first 3 days of age.  Average weight gain of 4-7 ounces (113-198 g) per week after age 4 days.  Consistent daily weight gain by age 5 days, without weight loss after the age of 2 weeks. After a feeding, your baby may spit up a small amount. This is common. Breastfeeding frequency and duration Frequent feeding will help you make more milk and can prevent sore nipples and breast engorgement. Breastfeed when you feel the need to reduce the fullness of your breasts or when your baby shows signs of hunger. This is called "breastfeeding on demand." Avoid introducing a pacifier to your baby while you are working to establish breastfeeding (the first 4-6 weeks after your baby is born). After this time you may choose to use a pacifier. Research has shown that pacifier use during the first year of a baby's life decreases the risk of sudden infant death syndrome (SIDS). Allow your baby to feed on each breast as long as he or she wants. Breastfeed until your baby is finished feeding. When your baby unlatches or falls asleep while feeding from the first breast, offer the second breast. Because newborns are often sleepy in the first few weeks of life, you may need to awaken your baby to get him or her to feed. Breastfeeding times will vary from baby to baby. However, the following rules can serve as a guide to help you ensure that your baby is properly fed:  Newborns (babies 4 weeks of age or younger) may breastfeed every 1-3 hours.  Newborns should not go longer than 3 hours during the day or 5 hours during the night  without breastfeeding.  You should breastfeed your baby a minimum of 8 times in a 24-hour period until you begin to introduce solid foods to your baby at around 6 months of age. Breast milk pumping Pumping and storing breast milk allows you to ensure that your baby is exclusively fed your breast milk, even at times when you are unable to breastfeed. This is especially important if you are going back to work while you are still breastfeeding or when you are not able to be present during feedings. Your lactation consultant can give you guidelines on how long it is safe to store breast milk. A breast pump is a machine that allows you to pump milk from your breast into a sterile bottle. The pumped breast milk can then be stored in a refrigerator or freezer. Some breast pumps are operated by hand, while others use electricity. Ask your lactation consultant which type will work best for you. Breast pumps can be purchased, but some hospitals and breastfeeding support groups lease breast pumps on a monthly basis. A lactation consultant can teach you how to   hand express breast milk, if you prefer not to use a pump. Caring for your breasts while you breastfeed Nipples can become dry, cracked, and sore while breastfeeding. The following recommendations can help keep your breasts moisturized and healthy:  Avoid using soap on your nipples.  Wear a supportive bra. Although not required, special nursing bras and tank tops are designed to allow access to your breasts for breastfeeding without taking off your entire bra or top. Avoid wearing underwire-style bras or extremely tight bras.  Air dry your nipples for 3-4minutes after each feeding.  Use only cotton bra pads to absorb leaked breast milk. Leaking of breast milk between feedings is normal.  Use lanolin on your nipples after breastfeeding. Lanolin helps to maintain your skin's normal moisture barrier. If you use pure lanolin, you do not need to wash it off  before feeding your baby again. Pure lanolin is not toxic to your baby. You may also hand express a few drops of breast milk and gently massage that milk into your nipples and allow the milk to air dry. In the first few weeks after giving birth, some women experience extremely full breasts (engorgement). Engorgement can make your breasts feel heavy, warm, and tender to the touch. Engorgement peaks within 3-5 days after you give birth. The following recommendations can help ease engorgement:  Completely empty your breasts while breastfeeding or pumping. You may want to start by applying warm, moist heat (in the shower or with warm water-soaked hand towels) just before feeding or pumping. This increases circulation and helps the milk flow. If your baby does not completely empty your breasts while breastfeeding, pump any extra milk after he or she is finished.  Wear a snug bra (nursing or regular) or tank top for 1-2 days to signal your body to slightly decrease milk production.  Apply ice packs to your breasts, unless this is too uncomfortable for you.  Make sure that your baby is latched on and positioned properly while breastfeeding. If engorgement persists after 48 hours of following these recommendations, contact your health care provider or a lactation consultant. Overall health care recommendations while breastfeeding  Eat healthy foods. Alternate between meals and snacks, eating 3 of each per day. Because what you eat affects your breast milk, some of the foods may make your baby more irritable than usual. Avoid eating these foods if you are sure that they are negatively affecting your baby.  Drink milk, fruit juice, and water to satisfy your thirst (about 10 glasses a day).  Rest often, relax, and continue to take your prenatal vitamins to prevent fatigue, stress, and anemia.  Continue breast self-awareness checks.  Avoid chewing and smoking tobacco. Chemicals from cigarettes that pass  into breast milk and exposure to secondhand smoke may harm your baby.  Avoid alcohol and drug use, including marijuana. Some medicines that may be harmful to your baby can pass through breast milk. It is important to ask your health care provider before taking any medicine, including all over-the-counter and prescription medicine as well as vitamin and herbal supplements. It is possible to become pregnant while breastfeeding. If birth control is desired, ask your health care provider about options that will be safe for your baby. Contact a health care provider if:  You feel like you want to stop breastfeeding or have become frustrated with breastfeeding.  You have painful breasts or nipples.  Your nipples are cracked or bleeding.  Your breasts are red, tender, or warm.  You have   a swollen area on either breast.  You have a fever or chills.  You have nausea or vomiting.  You have drainage other than breast milk from your nipples.  Your breasts do not become full before feedings by the fifth day after you give birth.  You feel sad and depressed.  Your baby is too sleepy to eat well.  Your baby is having trouble sleeping.  Your baby is wetting less than 3 diapers in a 24-hour period.  Your baby has less than 3 stools in a 24-hour period.  Your baby's skin or the white part of his or her eyes becomes yellow.  Your baby is not gaining weight by 5 days of age. Get help right away if:  Your baby is overly tired (lethargic) and does not want to wake up and feed.  Your baby develops an unexplained fever. This information is not intended to replace advice given to you by your health care provider. Make sure you discuss any questions you have with your health care provider. Document Released: 11/28/2005 Document Revised: 05/11/2016 Document Reviewed: 05/22/2013 Elsevier Interactive Patient Education  2017 Elsevier Inc.  

## 2016-11-02 NOTE — Progress Notes (Signed)
   PRENATAL VISIT NOTE  Subjective:  Melissa Quinn is a 30 y.o. [phone] at [redacted]w[redacted]d being seen today for ongoing prenatal care.  She is currently monitored for the following issues for this high-risk pregnancy and has Supervision of high risk pregnancy, antepartum; Chronic hypertension during pregnancy, antepartum; Previous cesarean delivery affecting pregnancy, antepartum; and History of severe pre-eclampsia on her problem list.  Patient reports no complaints.  Contractions: Not present. Vag. Bleeding: None.  Movement: Present. Denies leaking of fluid.   The following portions of the patient's history were reviewed and updated as appropriate: allergies, current medications, past family history, past medical history, past social history, past surgical history and problem list. Problem list updated.  Objective:   Vitals:   11/02/16 0914  BP: 123/85  Pulse: 82  Weight: 154 lb (69.9 kg)    Fetal Status: Fetal Heart Rate (bpm): 139 Fundal Height: 27 cm Movement: Present     General:  Alert, oriented and cooperative. Patient is in no acute distress.  Skin: Skin is warm and dry. No rash noted.   Cardiovascular: Normal heart rate noted  Respiratory: Normal respiratory effort, no problems with respiration noted  Abdomen: Soft, gravid, appropriate for gestational age. Pain/Pressure: Present     Pelvic:  Cervical exam deferred        Extremities: Normal range of motion.  Edema: None  Mental Status: Normal mood and affect. Normal behavior. Normal judgment and thought content.   Assessment and Plan:  Pregnancy: [id number] at [redacted]w[redacted]d  1. Chronic hypertension during pregnancy, antepartum Continue ASA, BP ok on no meds U/s for growth at [facility] ordered and q 4 wks.  2. Supervision of high risk pregnancy, antepartum Continue prenatal care. 28 wk labs today - Tdap vaccine greater than or equal to 7yo IM - CBC - HIV antibody - RPR - Glucose Tolerance, 2 Hours w/1 Hour  3. Previous cesarean  delivery affecting pregnancy, antepartum Signed TOLAC consent today  Preterm labor symptoms and general obstetric precautions including but not limited to vaginal bleeding, contractions, leaking of fluid and fetal movement were reviewed in detail with the patient. Please refer to After Visit Summary for other counseling recommendations.  Return in 2 weeks (on 11/16/2016).   Donnamae Jude, MD

## 2016-11-03 LAB — GLUCOSE TOLERANCE, 2 HOURS W/ 1HR
GLUCOSE, FASTING: 75 mg/dL (ref 65–99)
Glucose, 1 hour: 148 mg/dL
Glucose, 2 hour: 122 mg/dL (ref <140)

## 2016-11-03 LAB — HIV ANTIBODY (ROUTINE TESTING W REFLEX): HIV 1&2 Ab, 4th Generation: NONREACTIVE

## 2016-11-03 LAB — RPR

## 2016-11-16 ENCOUNTER — Ambulatory Visit (INDEPENDENT_AMBULATORY_CARE_PROVIDER_SITE_OTHER): Payer: Self-pay | Admitting: Obstetrics and Gynecology

## 2016-11-16 VITALS — BP 130/85 | HR 85 | Wt 154.0 lb

## 2016-11-16 DIAGNOSIS — O10919 Unspecified pre-existing hypertension complicating pregnancy, unspecified trimester: Secondary | ICD-10-CM

## 2016-11-16 DIAGNOSIS — O34219 Maternal care for unspecified type scar from previous cesarean delivery: Secondary | ICD-10-CM

## 2016-11-16 DIAGNOSIS — O0990 Supervision of high risk pregnancy, unspecified, unspecified trimester: Secondary | ICD-10-CM

## 2016-11-16 DIAGNOSIS — O10913 Unspecified pre-existing hypertension complicating pregnancy, third trimester: Secondary | ICD-10-CM

## 2016-11-16 NOTE — Progress Notes (Signed)
Prenatal Visit Note Date: 11/16/2016 Clinic: [facility]  Subjective:  Melissa Quinn is a 30 y.o. [phone] at [redacted]w[redacted]d being seen today for ongoing prenatal care.  She is currently monitored for the following issues for this high-risk pregnancy and has Supervision of high risk pregnancy, antepartum; Chronic hypertension during pregnancy, antepartum; Previous cesarean delivery affecting pregnancy, antepartum; and History of severe pre-eclampsia on her problem list.  Patient reports no complaints.   Contractions: Not present. Vag. Bleeding: None.  Movement: Present. Denies leaking of fluid.   The following portions of the patient's history were reviewed and updated as appropriate: allergies, current medications, past family history, past medical history, past social history, past surgical history and problem list. Problem list updated.  Objective:   Vitals:   11/16/16 0839  BP: 130/85  Pulse: 85  Weight: 154 lb (69.9 kg)    Fetal Status: Fetal Heart Rate (bpm): 145 Fundal Height: 30 cm Movement: Present     General:  Alert, oriented and cooperative. Patient is in no acute distress.  Skin: Skin is warm and dry. No rash noted.   Cardiovascular: Normal heart rate noted  Respiratory: Normal respiratory effort, no problems with respiration noted  Abdomen: Soft, gravid, appropriate for gestational age. Pain/Pressure: Present     Pelvic:  Cervical exam deferred        Extremities: Normal range of motion.  Edema: Trace  Mental Status: Normal mood and affect. Normal behavior. Normal judgment and thought content.   Urinalysis: Urine Protein: Trace Urine Glucose: Negative  Assessment and Plan:  Pregnancy: [id number] at [redacted]w[redacted]d  1. Supervision of high risk pregnancy, antepartum Routine care. Normal 28wk labs last visit. Paid for BTL already  2. Previous cesarean delivery affecting pregnancy, antepartum vbac consent signed already. D/w pt more at [facility]  3. Chronic hypertension  during pregnancy, antepartum On no meds and doing well. Had 12/4 growth scan at [facility] and request sent for results and repeat already scheduled. Start 2x/week testing in two weeks. On baby asa.   Preterm labor symptoms and general obstetric precautions including but not limited to vaginal bleeding, contractions, leaking of fluid and fetal movement were reviewed in detail with the patient. Please refer to After Visit Summary for other counseling recommendations.  Return in about 2 weeks (around 11/30/2016).   Aletha Halim, MD

## 2016-11-21 ENCOUNTER — Encounter: Payer: Self-pay | Admitting: *Deleted

## 2016-11-28 ENCOUNTER — Telehealth: Payer: Self-pay | Admitting: *Deleted

## 2016-11-28 NOTE — Telephone Encounter (Signed)
-----   Message from Francia Greaves sent at 11/28/2016 11:57 AM EST ----- Regarding: Advise Experiencing vaginal pain when she walks

## 2016-11-28 NOTE — Telephone Encounter (Signed)
Pt c/o occasional discomfort in her lower pelvis and leg. Denies bleeding, leaking of fluid, pressure, or contractions, + fetal movement. Informed pt that the discomfort could be related to the position of the baby and she could take Tylenol and recommended the use of a belly band to help with support. Also recommended certain pregnancy stretches.  Reviewed preterm labor precautions and instructed to call back with any further problems.

## 2016-12-02 ENCOUNTER — Ambulatory Visit (INDEPENDENT_AMBULATORY_CARE_PROVIDER_SITE_OTHER): Payer: Self-pay | Admitting: Obstetrics and Gynecology

## 2016-12-02 VITALS — BP 124/80 | HR 87 | Wt 156.0 lb

## 2016-12-02 DIAGNOSIS — O10919 Unspecified pre-existing hypertension complicating pregnancy, unspecified trimester: Secondary | ICD-10-CM

## 2016-12-02 DIAGNOSIS — O10913 Unspecified pre-existing hypertension complicating pregnancy, third trimester: Secondary | ICD-10-CM

## 2016-12-02 DIAGNOSIS — O0990 Supervision of high risk pregnancy, unspecified, unspecified trimester: Secondary | ICD-10-CM

## 2016-12-02 DIAGNOSIS — O34219 Maternal care for unspecified type scar from previous cesarean delivery: Secondary | ICD-10-CM

## 2016-12-02 LAB — CBC
HEMATOCRIT: 37 % (ref 35.0–45.0)
Hemoglobin: 12 g/dL (ref 11.7–15.5)
MCH: 29.6 pg (ref 27.0–33.0)
MCHC: 32.4 g/dL (ref 32.0–36.0)
MCV: 91.4 fL (ref 80.0–100.0)
MPV: 10.4 fL (ref 7.5–12.5)
PLATELETS: 304 10*3/uL (ref 140–400)
RBC: 4.05 MIL/uL (ref 3.80–5.10)
RDW: 14.1 % (ref 11.0–15.0)
WBC: 7.9 10*3/uL (ref 3.8–10.8)

## 2016-12-02 NOTE — Progress Notes (Signed)
Prenatal Visit Note Date: 12/02/2016 Clinic: [facility]  Subjective:  Melissa Quinn is a 30 y.o. [phone] at [redacted]w[redacted]d being seen today for ongoing prenatal care.  She is currently monitored for the following issues for this high-risk pregnancy and has Supervision of high risk pregnancy, antepartum; Chronic hypertension during pregnancy, antepartum; Previous cesarean delivery affecting pregnancy, antepartum; and History of severe pre-eclampsia on her problem list.  Patient reports no complaints.   Contractions: Irritability. Vag. Bleeding: None.  Movement: Present. Denies leaking of fluid.   The following portions of the patient's history were reviewed and updated as appropriate: allergies, current medications, past family history, past medical history, past social history, past surgical history and problem list. Problem list updated.  Objective:   Vitals:   12/02/16 0936 12/02/16 1000  BP: (!) 142/90 124/80  Pulse: 87   Weight: 156 lb (70.8 kg)     Fetal Status: Fetal Heart Rate (bpm): 152   Movement: Present  Presentation: Vertex  General:  Alert, oriented and cooperative. Patient is in no acute distress.  Skin: Skin is warm and dry. No rash noted.   Cardiovascular: Normal heart rate noted. Normal s1 and s2, no MRGs  Respiratory: Normal respiratory effort, no problems with respiration noted. CTAB  Abdomen: Soft, gravid, appropriate for gestational age. Pain/Pressure: Present. Nttp  Pelvic:  Cervical exam deferred        Extremities: Normal range of motion.  Edema: Trace  Mental Status: Normal mood and affect. Normal behavior. Normal judgment and thought content.  1+ brachial  Urinalysis:      Assessment and Plan:  Pregnancy: [id number] at [redacted]w[redacted]d  1. Supervision of high risk pregnancy, antepartum BTL paid for already - Fetal nonstress test - Amniotic fluid index - COMPLETE METABOLIC PANEL WITH GFR - Protein / Creatinine Ratio, Urine - CBC  2. Chronic  hypertension during pregnancy, antepartum On no meds. Rpt BP normal. Will get labs today. Precautions given. Pt on baby ASA already. Normal growth scan this month and has one month repeat already scheduled. Normal AFI and rNST today (140 baseline, +accels, no decel, mod var, toco quiet x 23min) - COMPLETE METABOLIC PANEL WITH GFR - Protein / Creatinine Ratio, Urine - CBC  3. Previous cesarean delivery affecting pregnancy, antepartum For rpt and TBL.   Preterm labor symptoms and general obstetric precautions including but not limited to vaginal bleeding, contractions, leaking of fluid and fetal movement were reviewed in detail with the patient. Please refer to After Visit Summary for other counseling recommendations.  1wk ROB. Continue with 2x/week testing.    Aletha Halim, MD

## 2016-12-03 LAB — COMPLETE METABOLIC PANEL WITH GFR
ALT: 16 U/L (ref 6–29)
AST: 22 U/L (ref 10–30)
Albumin: 3.2 g/dL — ABNORMAL LOW (ref 3.6–5.1)
Alkaline Phosphatase: 117 U/L — ABNORMAL HIGH (ref 33–115)
BILIRUBIN TOTAL: 0.3 mg/dL (ref 0.2–1.2)
BUN: 7 mg/dL (ref 7–25)
CO2: 21 mmol/L (ref 20–31)
CREATININE: 0.49 mg/dL — AB (ref 0.50–1.10)
Calcium: 9.1 mg/dL (ref 8.6–10.2)
Chloride: 104 mmol/L (ref 98–110)
GFR, Est African American: >89 mL/min (ref >=60)
GFR, Est Non African American: >89 mL/min (ref >=60)
Glucose, Bld: 80 mg/dL (ref 65–99)
Potassium: 4.7 mmol/L (ref 3.5–5.3)
Sodium: 138 mmol/L (ref 135–146)
TOTAL PROTEIN: 6.4 g/dL (ref 6.1–8.1)

## 2016-12-03 LAB — PROTEIN / CREATININE RATIO, URINE
CREATININE, URINE: 83 mg/dL (ref 20–320)
Protein Creatinine Ratio: 193 mg/g creat — ABNORMAL HIGH (ref 21–161)
Total Protein, Urine: 16 mg/dL (ref 5–24)

## 2016-12-06 ENCOUNTER — Ambulatory Visit (INDEPENDENT_AMBULATORY_CARE_PROVIDER_SITE_OTHER): Payer: Self-pay | Admitting: *Deleted

## 2016-12-06 DIAGNOSIS — O0990 Supervision of high risk pregnancy, unspecified, unspecified trimester: Secondary | ICD-10-CM

## 2016-12-06 DIAGNOSIS — O10013 Pre-existing essential hypertension complicating pregnancy, third trimester: Secondary | ICD-10-CM

## 2016-12-09 ENCOUNTER — Ambulatory Visit (INDEPENDENT_AMBULATORY_CARE_PROVIDER_SITE_OTHER): Payer: Self-pay | Admitting: Family Medicine

## 2016-12-09 VITALS — BP 144/90 | HR 87 | Wt 156.0 lb

## 2016-12-09 DIAGNOSIS — O10913 Unspecified pre-existing hypertension complicating pregnancy, third trimester: Secondary | ICD-10-CM

## 2016-12-09 DIAGNOSIS — O0990 Supervision of high risk pregnancy, unspecified, unspecified trimester: Secondary | ICD-10-CM

## 2016-12-09 DIAGNOSIS — O34219 Maternal care for unspecified type scar from previous cesarean delivery: Secondary | ICD-10-CM

## 2016-12-09 DIAGNOSIS — O10919 Unspecified pre-existing hypertension complicating pregnancy, unspecified trimester: Secondary | ICD-10-CM

## 2016-12-09 DIAGNOSIS — Z8759 Personal history of other complications of pregnancy, childbirth and the puerperium: Secondary | ICD-10-CM

## 2016-12-09 NOTE — Progress Notes (Signed)
NST Note Date: 12/06/2016 Gestational Age: 30/2 FHT: 145 baseline, positive accelerations, negative, deceleration, moderateve variability Toco: quiet Time: 35 minutes  A/P: rNST. Continue with current plan of care  Durene Romans MD Attending Center for [facility])

## 2016-12-09 NOTE — Progress Notes (Signed)
   PRENATAL VISIT NOTE  Subjective:  Melissa Quinn is a 30 y.o. [phone] at [redacted]w[redacted]d being seen today for ongoing prenatal care.  She is currently monitored for the following issues for this high-risk pregnancy and has Supervision of high risk pregnancy, antepartum; Chronic hypertension during pregnancy, antepartum; Previous cesarean delivery affecting pregnancy, antepartum; and History of severe pre-eclampsia on her problem list.  Patient reports no complaints.  Contractions: Irritability. Vag. Bleeding: None.  Movement: Present. Denies leaking of fluid.   The following portions of the patient's history were reviewed and updated as appropriate: allergies, current medications, past family history, past medical history, past social history, past surgical history and problem list. Problem list updated.  Objective:   Vitals:   12/09/16 1008  BP: (!) 144/90  Pulse: 87  Weight: 156 lb (70.8 kg)    Fetal Status:     Movement: Present  Presentation: Vertex  General:  Alert, oriented and cooperative. Patient is in no acute distress.  Skin: Skin is warm and dry. No rash noted.   Cardiovascular: Normal heart rate noted  Respiratory: Normal respiratory effort, no problems with respiration noted  Abdomen: Soft, gravid, appropriate for gestational age. Pain/Pressure: Present     Pelvic:  Cervical exam deferred        Extremities: Normal range of motion.  Edema: Trace  Mental Status: Normal mood and affect. Normal behavior. Normal judgment and thought content.   Assessment and Plan:  Pregnancy: [id number] at [redacted]w[redacted]d  1. Maternal chronic hypertension in third trimester 145/mod/+accels, 1 spontaneous deceleration to 90 with return to baseline within 1 min, nadir <30 sec and return to excellent beat to beat variability - AFI wnl (10.1) - Amniotic fluid index with NST  2. Chronic hypertension during pregnancy, antepartum BP elevated today initially, improved on recheck.  Denies s/sx of  preeclampsia  3. Supervision of high risk pregnancy, antepartum 2x weekly NSTs- already scheduled UTD otherwise Desires BTL, paid in full Planning on BF  4. Previous cesarean delivery affecting pregnancy, antepartum Desires TOLAC, confirmed again today  5. History of severe pre-eclampsia Continue ASA until 36 weeks  Preterm labor symptoms and general obstetric precautions including but not limited to vaginal bleeding, contractions, leaking of fluid and fetal movement were reviewed in detail with the patient. Please refer to After Visit Summary for other counseling recommendations.  Return in about 4 days (around 12/13/2016) for Scheduled NST.   Caren Macadam, MD

## 2016-12-09 NOTE — Progress Notes (Signed)
When pt arrived to her visit on 12/29, her first blood pressure reading was 144/90 on the left arm. An hour later, I was told to take her blood pressure again per Dr.Newton and it was 150/90 on the right arm and 127/85 on the left arm.

## 2016-12-12 NOTE — L&D Delivery Note (Signed)
OB Delivery Summary  31 y.o. [id number] at [redacted]w[redacted]d delivered a viable female infant in cephalic, LOA position. There was no nuchal cord. The anterior shoulder delivered with ease. A 60 sec delayed cord clamping was performed. Cord clamped x2 and cut. Placenta delivered spontaneously intact, with 3VC. Fundus firm on exam with massage and pitocin. Good hemostasis noted.  Anesthesia: Epidural Laceration: None Suture: None required Good hemostasis noted. EBL: 100 cc  Mom and baby recovering in LDR.    Apgars: APGAR (1 MIN):  9 APGAR (5 MINS):  9 Weight: Pending skin to skin  Plan for patient to go to the OR for BTL either later this afternoon or tomorrow.     Crissie Sickles, MD PGY-2 01/02/2017, 1:04 PM

## 2016-12-13 ENCOUNTER — Encounter (HOSPITAL_COMMUNITY): Payer: Self-pay

## 2016-12-13 ENCOUNTER — Inpatient Hospital Stay (HOSPITAL_COMMUNITY)
Admission: AD | Admit: 2016-12-13 | Discharge: 2016-12-14 | Disposition: A | Discharge status: Home / Self Care | Payer: Self-pay | Source: Ambulatory Visit | Attending: Obstetrics & Gynecology | Admitting: Obstetrics & Gynecology

## 2016-12-13 ENCOUNTER — Ambulatory Visit (INDEPENDENT_AMBULATORY_CARE_PROVIDER_SITE_OTHER): Payer: Self-pay | Admitting: *Deleted

## 2016-12-13 VITALS — BP 112/73 | HR 80 | Wt 156.0 lb

## 2016-12-13 DIAGNOSIS — O10919 Unspecified pre-existing hypertension complicating pregnancy, unspecified trimester: Secondary | ICD-10-CM

## 2016-12-13 DIAGNOSIS — O163 Unspecified maternal hypertension, third trimester: Secondary | ICD-10-CM | POA: Insufficient documentation

## 2016-12-13 DIAGNOSIS — Z79899 Other long term (current) drug therapy: Secondary | ICD-10-CM | POA: Insufficient documentation

## 2016-12-13 DIAGNOSIS — O0990 Supervision of high risk pregnancy, unspecified, unspecified trimester: Secondary | ICD-10-CM

## 2016-12-13 DIAGNOSIS — O479 False labor, unspecified: Secondary | ICD-10-CM

## 2016-12-13 DIAGNOSIS — Z3A34 34 weeks gestation of pregnancy: Secondary | ICD-10-CM | POA: Insufficient documentation

## 2016-12-13 DIAGNOSIS — Z7982 Long term (current) use of aspirin: Secondary | ICD-10-CM | POA: Insufficient documentation

## 2016-12-13 DIAGNOSIS — O4700 False labor before 37 completed weeks of gestation, unspecified trimester: Secondary | ICD-10-CM

## 2016-12-13 DIAGNOSIS — Z9889 Other specified postprocedural states: Secondary | ICD-10-CM | POA: Insufficient documentation

## 2016-12-13 DIAGNOSIS — O10913 Unspecified pre-existing hypertension complicating pregnancy, third trimester: Secondary | ICD-10-CM

## 2016-12-13 DIAGNOSIS — Z880 Allergy status to penicillin: Secondary | ICD-10-CM | POA: Insufficient documentation

## 2016-12-13 DIAGNOSIS — O26893 Other specified pregnancy related conditions, third trimester: Secondary | ICD-10-CM | POA: Insufficient documentation

## 2016-12-13 LAB — URINALYSIS, ROUTINE W REFLEX MICROSCOPIC
BILIRUBIN URINE: NEGATIVE
Glucose, UA: NEGATIVE mg/dL
HGB URINE DIPSTICK: NEGATIVE
Ketones, ur: NEGATIVE mg/dL
Leukocytes, UA: NEGATIVE
Nitrite: NEGATIVE
Protein, ur: NEGATIVE mg/dL
SPECIFIC GRAVITY, URINE: 1.013 (ref 1.005–1.030)
pH: 7 (ref 5.0–8.0)

## 2016-12-13 LAB — COMPREHENSIVE METABOLIC PANEL WITH GFR
ALT: 21 U/L (ref 14–54)
AST: 24 U/L (ref 15–41)
Albumin: 3 g/dL — ABNORMAL LOW (ref 3.5–5.0)
Alkaline Phosphatase: 132 U/L — ABNORMAL HIGH (ref 38–126)
Anion gap: 8 (ref 5–15)
BUN: 9 mg/dL (ref 6–20)
CO2: 22 mmol/L (ref 22–32)
Calcium: 9.3 mg/dL (ref 8.9–10.3)
Chloride: 105 mmol/L (ref 101–111)
Creatinine, Ser: 0.46 mg/dL (ref 0.44–1.00)
GFR calc Af Amer: >60 mL/min
GFR calc non Af Amer: >60 mL/min
Glucose, Bld: 93 mg/dL (ref 65–99)
Potassium: 3.3 mmol/L — ABNORMAL LOW (ref 3.5–5.1)
Sodium: 135 mmol/L (ref 135–145)
Total Bilirubin: 0.3 mg/dL (ref 0.3–1.2)
Total Protein: 7 g/dL (ref 6.5–8.1)

## 2016-12-13 LAB — CBC
HCT: 32.3 % — ABNORMAL LOW (ref 36.0–46.0)
Hemoglobin: 11.1 g/dL — ABNORMAL LOW (ref 12.0–15.0)
MCH: 30.7 pg (ref 26.0–34.0)
MCHC: 34.4 g/dL (ref 30.0–36.0)
MCV: 89.2 fL (ref 78.0–100.0)
Platelets: 248 K/uL (ref 150–400)
RBC: 3.62 MIL/uL — ABNORMAL LOW (ref 3.87–5.11)
RDW: 14.3 % (ref 11.5–15.5)
WBC: 8.7 K/uL (ref 4.0–10.5)

## 2016-12-13 LAB — PROTEIN / CREATININE RATIO, URINE
Creatinine, Urine: 88 mg/dL
PROTEIN CREATININE RATIO: 0.25 mg/mg{creat} — AB (ref 0.00–0.15)
TOTAL PROTEIN, URINE: 22 mg/dL

## 2016-12-13 LAB — POCT PREGNANCY, URINE: Preg Test, Ur: POSITIVE — AB

## 2016-12-13 MED ORDER — LABETALOL HCL 100 MG PO TABS
200.0000 mg | ORAL_TABLET | Freq: Once | ORAL | Status: AC
  Administered 2016-12-13: 200 mg via ORAL
  Filled 2016-12-13: qty 2

## 2016-12-13 NOTE — Progress Notes (Signed)
NST reviewed and reactive, technically, after VAS x 2, sprite, position change.

## 2016-12-13 NOTE — Progress Notes (Signed)
Notified of pt arrival in MAU with severe range BP and hx of pre-eclampsia. In delivery and requested MAU provider to see pt and will come see pt when finished with delivery.

## 2016-12-13 NOTE — MAU Provider Note (Signed)
History  Chief Complaint:  Abdominal Pain  Melissa Quinn is a 31 y.o. [phone] female at [redacted]w[redacted]d presenting w/ report of uc's since 1600, worsening around 2100. Denies ha, visual changes, ruq/epigastric pain, n/v.   Reports active fetal movement, contractions: regular, every 5 minutes, vaginal bleeding: none, membranes: intact. Denies uti s/s, abnormal/malodorous vag d/c or vulvovaginal itching/irritation.  States uc's have actually improved since she got here.  [facility]renatal [facility] at [facility].  Next visit 1/5. Pregnancy complicated by [facility]- no meds, was on lisinopril prior to pregnancy; h/o svb x 3 then PLTCS d/t fetal intolerance w/ IOL @ 33wks d/t severe pre-e.   Obstetrical History: OB History    Gravida Para Term Preterm AB Living   5 4 2 2   4    SAB TAB Ectopic Multiple Live Births           4      Past Medical History: Past Medical History:  Diagnosis Date  . Abnormal Pap smear    colpo 2013  . Chlamydia 2011  . Chronic hypertension 09/2013    Past Surgical History: Past Surgical History:  Procedure Laterality Date  . CESAREAN SECTION N/A 10/23/2013   Procedure: Primary Cesarean Section Delivery Baby Girl @ 0004, Apgars 8/8;  Surgeon: Jonnie Kind, MD;  Location: [facility];  Service: Obstetrics;  Laterality: N/A;    Social History: Social History   Social History  . Marital status: Married    Spouse name: N/A  . Number of children: N/A  . Years of education: N/A   Social History Main Topics  . Smoking status: Never Smoker  . Smokeless tobacco: Never Used  . Alcohol use No  . Drug use: No  . Sexual activity: Yes    Birth control/ protection: None   Other Topics Concern  . None   Social History Narrative  . None    Allergies: Allergies  Allergen Reactions  . Penicillins Hives    Prescriptions Prior to Admission  Medication Sig Dispense Refill Last Dose  . aspirin EC 81 MG tablet Take 1 tablet (81 mg total) by mouth daily. 100 tablet 2 Taking  .  Doxylamine-Pyridoxine 10-10 MG TBEC Take 2 tabs qhs, after 3 days may increase to 1tab am, 1tab mid-day, 2tabs qhs 60 tablet 6 Taking  . Prenatal Vit-Fe Fumarate-FA (PRENATAL MULTIVITAMIN) TABS tablet Take 1 tablet by mouth daily at 12 noon.   Taking    Review of Systems  Pertinent pos/neg as indicated in HPI  Physical Exam  Blood pressure (!) 145/101, pulse 83, temperature 98 F (36.7 C), temperature source Oral, resp. rate 18, last menstrual period 04/17/2016. General appearance: alert, cooperative and no distress Lungs: clear to auscultation bilaterally, normal effort Heart: regular rate and rhythm Abdomen: gravid, soft, non-tender Extremities: no edema DTR's 2+, no clonus  BPs since arrival: 169/104, 152/99, 146/91, 145/101, 159/94, 142/88, 146/75, 136/77, 132/73  Spec exam: n/a Cultures/Specimens: n/a Dilation: Fingertip Effacement (%): Thick Station: -3 Exam by:: Arts development officer Presentation: cephalic  Fetal monitoring: FHR: 150 bpm, variability: moderate,  Accelerations: Present,  decelerations:  Absent Uterine activity: q 2-37mins  MAU Course  Pre-e labs NST SVE ft/th/high x 2 Labetalol 200mg  po x 1 with improvement of bp  Labs:  Results for orders placed or performed during the hospital encounter of 12/13/16 (from the past 24 hour(s))  Urinalysis, Routine w reflex microscopic     Status: Abnormal   Collection Time: 12/13/16  9:48 PM  Result Value  Ref Range   Color, Urine YELLOW YELLOW   APPearance HAZY (A) CLEAR   Specific Gravity, Urine 1.013 1.005 - 1.030   pH 7.0 5.0 - 8.0   Glucose, UA NEGATIVE NEGATIVE mg/dL   Hgb urine dipstick NEGATIVE NEGATIVE   Bilirubin Urine NEGATIVE NEGATIVE   Ketones, ur NEGATIVE NEGATIVE mg/dL   Protein, ur NEGATIVE NEGATIVE mg/dL   Nitrite NEGATIVE NEGATIVE   Leukocytes, UA NEGATIVE NEGATIVE  Protein / creatinine ratio, urine     Status: Abnormal   Collection Time: 12/13/16  9:48 PM  Result Value Ref Range   Creatinine,  Urine 88.00 mg/dL   Total Protein, Urine 22 mg/dL   Protein Creatinine Ratio 0.25 (H) 0.00 - 0.15 mg/mg[Cre]  Pregnancy, urine POC     Status: Abnormal   Collection Time: 12/13/16 10:32 PM  Result Value Ref Range   Preg Test, Ur POSITIVE (A) NEGATIVE  CBC     Status: Abnormal   Collection Time: 12/13/16 11:02 PM  Result Value Ref Range   WBC 8.7 4.0 - 10.5 K/uL   RBC 3.62 (L) 3.87 - 5.11 MIL/uL   Hemoglobin 11.1 (L) 12.0 - 15.0 g/dL   HCT 32.3 (L) 36.0 - 46.0 %   MCV 89.2 78.0 - 100.0 fL   MCH 30.7 26.0 - 34.0 pg   MCHC 34.4 30.0 - 36.0 g/dL   RDW 14.3 11.5 - 15.5 %   Platelets 248 150 - 400 K/uL    Imaging:  n/a Assessment and Plan  A:  [redacted]w[redacted]d SIUP  [id number]  CHTN, now on meds  H/O severe pre-e- 1 severe range bp tonight, asymptomatic, labs normal  Preterm uc's w/o cervical change  Prev c/s x 1, plans TOLAC  Cat 1 FHR P:  Discussed w/ Dr. Roselie Awkward, to d/c home  Labetalol 200mg  BID for [facility] 24hr urine in am and bring back 1/4 am  H/O severe pre-e @ 33wks- 1 severe-range bp tonight, asymptomatic, labs normal, bp's normal range after labetalol po here  Reviewed ptl s/s, fkc, pre-e s/s, reasons to return, w/ hx of severe pre-e at 33wks, to seek care asap if any sx develop  Keep next appt at [facility] on 1/5 as scheduled   Tawnya Crook CNM,WHNP-BC 1/2/201811:21 PM

## 2016-12-13 NOTE — MAU Note (Signed)
Pt presents complaining of pain in her lower abdomen and back that started at 1600. States it feels like contractions and is coming and going. Denies leaking or bleeding. Reports good fetal movement.

## 2016-12-14 DIAGNOSIS — Z3A34 34 weeks gestation of pregnancy: Secondary | ICD-10-CM

## 2016-12-14 DIAGNOSIS — O4703 False labor before 37 completed weeks of gestation, third trimester: Secondary | ICD-10-CM

## 2016-12-14 DIAGNOSIS — O10913 Unspecified pre-existing hypertension complicating pregnancy, third trimester: Secondary | ICD-10-CM

## 2016-12-14 MED ORDER — LABETALOL HCL 200 MG PO TABS: 200.0000 mg | ORAL_TABLET | Freq: Two times a day (BID) | ORAL | 3 refills | Status: DC

## 2016-12-14 MED FILL — LABETALOL HCL 200 MG TABLET: 200 | 30 days supply | Qty: 60 | Fill #0

## 2016-12-14 NOTE — Discharge Instructions (Signed)
Start your 24 hour urine in the morning (12/14/16 AM) and bring back to [facility] hospital lab 12/15/16 AM  Call the office or go to [facility] if:  You begin to have strong, frequent contractions  Your water breaks.  Sometimes it is a big gush of fluid, sometimes it is just a trickle that keeps getting your panties wet or running down your legs  You have vaginal bleeding.  It is normal to have a small amount of spotting if your cervix was checked.   You don't feel your baby moving like normal.  If you don't, get you something to eat and drink and lay down and focus on feeling your baby move.  You should feel at least 10 movements in 2 hours.  If you don't, you should call the office or go to [facility].    Call the office or go to [facility] hospital for these signs of pre-eclampsia:  Severe headache that does not go away with Tylenol  Visual changes- seeing spots, double, blurred vision  Pain under your right breast or upper abdomen that does not go away with Tums or heartburn medicine  Nausea and/or vomiting  Severe swelling in your hands, feet, and face    Hypertension During Pregnancy Hypertension, commonly called high blood pressure, is when the force of blood pumping through your arteries is too strong. Arteries are blood vessels that carry blood from the heart throughout the body. Hypertension during pregnancy can cause problems for you and your baby. Your baby may be born early (prematurely) or may not weigh as much as he or she should at birth. Very bad cases of hypertension during pregnancy can be life-threatening. Different types of hypertension can occur during pregnancy. These include:  Chronic hypertension. This happens when:  You have hypertension before pregnancy and it continues during pregnancy.  You develop hypertension before you are [redacted] weeks pregnant, and it continues during pregnancy.  Gestational hypertension. This is hypertension that develops after the  20th week of pregnancy.  Preeclampsia, also called toxemia of pregnancy. This is a very serious type of hypertension that develops only during pregnancy. It affects the whole body, and it can be very dangerous for you and your baby. Gestational hypertension and preeclampsia usually go away within 6 weeks after your baby is born. Women who have hypertension during pregnancy have a greater chance of developing hypertension later in life or during future pregnancies. What are the causes? The exact cause of hypertension is not known. What increases the risk? There are certain factors that make it more likely for you to develop hypertension during pregnancy. These include:  Having hypertension during a previous pregnancy or prior to pregnancy.  Being overweight.  Being older than age 68.  Being pregnant for the first time or being pregnant with more than one baby.  Becoming pregnant using fertilization methods such as IVF (in vitro fertilization).  Having diabetes, kidney problems, or systemic lupus erythematosus.  Having a family history of hypertension. What are the signs or symptoms? Chronic hypertension and gestational hypertension rarely cause symptoms. Preeclampsia causes symptoms, which may include:  Increased protein in your urine. Your health care provider will check for this at every visit before you give birth (prenatal visit).  Severe headaches.  Sudden weight gain.  Swelling of the hands, face, legs, and feet.  Nausea and vomiting.  Vision problems, such as blurred or double vision.  Numbness in the face, arms, legs, and feet.  Dizziness.  Slurred speech.  Sensitivity to bright lights.  Abdominal pain.  Convulsions. How is this diagnosed? You may be diagnosed with hypertension during a routine prenatal exam. At each prenatal visit, you may:  Have a urine test to check for high amounts of protein in your urine.  Have your blood pressure checked. A blood  pressure reading is recorded as two numbers, such as "120 over 80" (or 120/80). The first ("top") number is called the systolic pressure. It is a measure of the pressure in your arteries when your heart beats. The second ("bottom") number is called the diastolic pressure. It is a measure of the pressure in your arteries as your heart relaxes between beats. Blood pressure is measured in a unit called mm Hg. A normal blood pressure reading is:  Systolic: below [id number].  Diastolic: below 80. The type of hypertension that you are diagnosed with depends on your test results and when your symptoms developed.  Chronic hypertension is usually diagnosed before 20 weeks of pregnancy.  Gestational hypertension is usually diagnosed after 20 weeks of pregnancy.  Hypertension with high amounts of protein in the urine is diagnosed as preeclampsia.  Blood pressure measurements that stay above 0000000 systolic, or above [id number] diastolic, are signs of severe preeclampsia. How is this treated? Treatment for hypertension during pregnancy varies depending on the type of hypertension you have and how serious it is.  If you take medicines called ACE inhibitors to treat chronic hypertension, you may need to switch medicines. ACE inhibitors should not be taken during pregnancy.  If you have gestational hypertension, you may need to take blood pressure medicine.  If you are at risk for preeclampsia, your health care provider may recommend that you take a low-dose aspirin every day to prevent high blood pressure during your pregnancy.  If you have severe preeclampsia, you may need to be hospitalized so you and your baby can be monitored closely. You may also need to take medicine (magnesium sulfate) to prevent seizures and to lower blood pressure. This medicine may be given as an injection or through an IV tube.  In some cases, if your condition gets worse, you may need to deliver your baby early. Follow these instructions at  home: Eating and drinking  Drink enough fluid to keep your urine clear or pale yellow.  Eat a healthy diet that is low in salt (sodium). Do not add salt to your food. Check food labels to see how much sodium a food or beverage contains. Lifestyle  Do not use any products that contain nicotine or tobacco, such as cigarettes and e-cigarettes. If you need help quitting, ask your health care provider.  Do not use alcohol.  Avoid caffeine.  Avoid stress as much as possible. Rest and get plenty of sleep. General instructions  Take over-the-counter and prescription medicines only as told by your health care provider.  While lying down, lie on your left side. This keeps pressure off your baby.  While sitting or lying down, raise (elevate) your feet. Try putting some pillows under your lower legs.  Exercise regularly. Ask your health care provider what kinds of exercise are best for you.  Keep all prenatal and follow-up visits as told by your health care provider. This is important. Contact a health care provider if:  You have symptoms that your health care provider told you may require more treatment or monitoring, such as:  Fever.  Vomiting.  Headache. Get help right away if:  You have severe abdominal pain or vomiting  that does not get better with treatment.  You suddenly develop swelling in your hands, ankles, or face.  You gain 4 lbs (1.8 kg) or more in 1 week.  You develop vaginal bleeding, or you have blood in your urine.  You do not feel your baby moving as much as usual.  You have blurred or double vision.  You have muscle twitching or sudden tightening (spasms).  You have shortness of breath.  Your lips or fingernails turn blue. This information is not intended to replace advice given to you by your health care provider. Make sure you discuss any questions you have with your health care provider. Document Released: 08/16/2011 Document Revised: 06/17/2016  Document Reviewed: 05/13/2016 Elsevier Interactive Patient Education  2017 [employer].    Preterm Labor and Birth Information The normal length of a pregnancy is 39-41 weeks. Preterm labor is when labor starts before 37 completed weeks of pregnancy. What are the risk factors for preterm labor? Preterm labor is more likely to occur in women who:  Have certain infections during pregnancy such as a bladder infection, sexually transmitted infection, or infection inside the uterus (chorioamnionitis).  Have a shorter-than-normal cervix.  Have gone into preterm labor before.  Have had surgery on their cervix.  Are younger than age 20 or older than age 58.  Are African American.  Are pregnant with twins or multiple babies (multiple gestation).  Take street drugs or smoke while pregnant.  Do not gain enough weight while pregnant.  Became pregnant shortly after having been pregnant. What are the symptoms of preterm labor? Symptoms of preterm labor include:  Cramps similar to those that can happen during a menstrual period. The cramps may happen with diarrhea.  Pain in the abdomen or lower back.  Regular uterine contractions that may feel like tightening of the abdomen.  A feeling of increased pressure in the pelvis.  Increased watery or bloody mucus discharge from the vagina.  Water breaking (ruptured amniotic sac). Why is it important to recognize signs of preterm labor? It is important to recognize signs of preterm labor because babies who are born prematurely may not be fully developed. This can put them at an increased risk for:  Long-term (chronic) heart and lung problems.  Difficulty immediately after birth with regulating body systems, including blood sugar, body temperature, heart rate, and breathing rate.  Bleeding in the brain.  Cerebral palsy.  Learning difficulties.  Death. These risks are highest for babies who are born before 18 weeks of pregnancy. How  is preterm labor treated? Treatment depends on the length of your pregnancy, your condition, and the health of your baby. It may involve:  Having a stitch (suture) placed in your cervix to prevent your cervix from opening too early (cerclage).  Taking or being given medicines, such as:  Hormone medicines. These may be given early in pregnancy to help support the pregnancy.  Medicine to stop contractions.  Medicines to help mature the babys lungs. These may be prescribed if the risk of delivery is high.  Medicines to prevent your baby from developing cerebral palsy. If the labor happens before 34 weeks of pregnancy, you may need to stay in the hospital. What should I do if I think I am in preterm labor? If you think that you are going into preterm labor, call your health care provider right away. How can I prevent preterm labor in future pregnancies? To increase your chance of having a full-term pregnancy:  Do not use  any tobacco products, such as cigarettes, chewing tobacco, and e-cigarettes. If you need help quitting, ask your health care provider.  Do not use street drugs or medicines that have not been prescribed to you during your pregnancy.  Talk with your health care provider before taking any herbal supplements, even if you have been taking them regularly.  Make sure you gain a healthy amount of weight during your pregnancy.  Watch for infection. If you think that you might have an infection, get it checked right away.  Make sure to tell your health care provider if you have gone into preterm labor before. This information is not intended to replace advice given to you by your health care provider. Make sure you discuss any questions you have with your health care provider. Document Released: 02/18/2004 Document Revised: 05/10/2016 Document Reviewed: 04/20/2016 Elsevier Interactive Patient Education  2017 [employer].

## 2016-12-15 ENCOUNTER — Other Ambulatory Visit: Payer: Self-pay

## 2016-12-16 ENCOUNTER — Ambulatory Visit (INDEPENDENT_AMBULATORY_CARE_PROVIDER_SITE_OTHER): Payer: Self-pay | Admitting: Obstetrics & Gynecology

## 2016-12-16 VITALS — BP 132/85 | HR 93 | Wt 157.0 lb

## 2016-12-16 DIAGNOSIS — O34219 Maternal care for unspecified type scar from previous cesarean delivery: Secondary | ICD-10-CM

## 2016-12-16 DIAGNOSIS — O0990 Supervision of high risk pregnancy, unspecified, unspecified trimester: Secondary | ICD-10-CM

## 2016-12-16 DIAGNOSIS — Z3689 Encounter for other specified antenatal screening: Secondary | ICD-10-CM

## 2016-12-16 DIAGNOSIS — Z8759 Personal history of other complications of pregnancy, childbirth and the puerperium: Secondary | ICD-10-CM

## 2016-12-16 DIAGNOSIS — O10913 Unspecified pre-existing hypertension complicating pregnancy, third trimester: Secondary | ICD-10-CM

## 2016-12-16 DIAGNOSIS — O10919 Unspecified pre-existing hypertension complicating pregnancy, unspecified trimester: Secondary | ICD-10-CM

## 2016-12-16 LAB — PROTEIN, URINE, 24 HOUR
PROTEIN 24H UR: 345 mg/(24.h) — AB (ref <150)
Protein, Urine: 15 mg/dL (ref 5–24)

## 2016-12-16 NOTE — Progress Notes (Signed)
   PRENATAL VISIT NOTE  Subjective:  Melissa Quinn is a 31 y.o. [phone] at [redacted]w[redacted]d being seen today for ongoing prenatal care.  She is currently monitored for the following issues for this high-risk pregnancy and has Supervision of high risk pregnancy, antepartum; Chronic hypertension during pregnancy, antepartum; Previous cesarean delivery affecting pregnancy, antepartum; and History of severe pre-eclampsia on her problem list.  Patient reports no complaints.  Contractions: Irregular. Vag. Bleeding: None.  Movement: Present. Denies leaking of fluid.   The following portions of the patient's history were reviewed and updated as appropriate: allergies, current medications, past family history, past medical history, past social history, past surgical history and problem list. Problem list updated.  Objective:   Vitals:   12/16/16 1016  BP: 132/85  Pulse: 93  Weight: 157 lb (71.2 kg)    Fetal Status: Fetal Heart Rate (bpm): 156   Movement: Present     General:  Alert, oriented and cooperative. Patient is in no acute distress.  Skin: Skin is warm and dry. No rash noted.   Cardiovascular: Normal heart rate noted  Respiratory: Normal respiratory effort, no problems with respiration noted  Abdomen: Soft, gravid, appropriate for gestational age. Pain/Pressure: Present     Pelvic:  Cervical exam deferred        Extremities: Normal range of motion.  Edema: Trace  Mental Status: Normal mood and affect. Normal behavior. Normal judgment and thought content.   Assessment and Plan:  Pregnancy: [id number] at [redacted]w[redacted]d  1. Maternal chronic hypertension in third trimester - IOL at 22 weeks/prn sooner - Amniotic fluid index with NST  2. Supervision of high risk pregnancy, antepartum   3. Previous cesarean delivery affecting pregnancy, antepartum   4. History of severe pre-eclampsia - rec modified bedrest  5. Chronic hypertension during pregnancy, antepartum - continue baby asa and labetolol 200  mg BID  Preterm labor symptoms and general obstetric precautions including but not limited to vaginal bleeding, contractions, leaking of fluid and fetal movement were reviewed in detail with the patient. Please refer to After Visit Summary for other counseling recommendations.  No Follow-up on file.   Emily Filbert, MD

## 2016-12-20 ENCOUNTER — Ambulatory Visit (INDEPENDENT_AMBULATORY_CARE_PROVIDER_SITE_OTHER): Payer: Self-pay | Admitting: *Deleted

## 2016-12-20 VITALS — BP 133/86 | HR 81

## 2016-12-20 DIAGNOSIS — O10913 Unspecified pre-existing hypertension complicating pregnancy, third trimester: Secondary | ICD-10-CM

## 2016-12-22 ENCOUNTER — Encounter: Payer: Self-pay | Admitting: *Deleted

## 2016-12-23 ENCOUNTER — Ambulatory Visit (INDEPENDENT_AMBULATORY_CARE_PROVIDER_SITE_OTHER): Payer: Self-pay | Admitting: Obstetrics & Gynecology

## 2016-12-23 VITALS — BP 129/87 | HR 98 | Wt 160.0 lb

## 2016-12-23 DIAGNOSIS — O113 Pre-existing hypertension with pre-eclampsia, third trimester: Secondary | ICD-10-CM

## 2016-12-23 DIAGNOSIS — O0990 Supervision of high risk pregnancy, unspecified, unspecified trimester: Secondary | ICD-10-CM

## 2016-12-23 DIAGNOSIS — Z302 Encounter for sterilization: Secondary | ICD-10-CM | POA: Insufficient documentation

## 2016-12-23 DIAGNOSIS — Z113 Encounter for screening for infections with a predominantly sexual mode of transmission: Secondary | ICD-10-CM

## 2016-12-23 DIAGNOSIS — O119 Pre-existing hypertension with pre-eclampsia, unspecified trimester: Secondary | ICD-10-CM

## 2016-12-23 LAB — OB RESULTS CONSOLE GBS: GBS: NEGATIVE

## 2016-12-23 NOTE — Patient Instructions (Signed)
Preeclampsia and Eclampsia Preeclampsia is a serious condition that develops only during pregnancy. It is also called toxemia of pregnancy. This condition causes high blood pressure along with other symptoms, such as swelling and headaches. These symptoms may develop as the condition gets worse. Preeclampsia may occur at 20 weeks of pregnancy or later. Diagnosing and treating preeclampsia early is very important. If not treated early, it can cause serious problems for you and your baby. One problem it can lead to is eclampsia, which is a condition that causes muscle jerking or shaking (convulsions or seizures) in the mother. Delivering your baby is the best treatment for preeclampsia or eclampsia. Preeclampsia and eclampsia symptoms usually go away after your baby is born. What are the causes? The cause of preeclampsia is not known. What increases the risk? The following risk factors make you more likely to develop preeclampsia:  Being pregnant for the first time.  Having had preeclampsia during a past pregnancy.  Having a family history of preeclampsia.  Having high blood pressure.  Being pregnant with twins or triplets.  Being 35 or older.  Being African-American.  Having kidney disease or diabetes.  Having medical conditions such as lupus or blood diseases.  Being very overweight (obese). What are the signs or symptoms? The earliest signs of preeclampsia are:  High blood pressure.  Increased protein in your urine. Your health care provider will check for this at every visit before you give birth (prenatal visit). Other symptoms that may develop as the condition gets worse include:  Severe headaches.  Sudden weight gain.  Swelling of the hands, face, legs, and feet.  Nausea and vomiting.  Vision problems, such as blurred or double vision.  Numbness in the face, arms, legs, and feet.  Urinating less than usual.  Dizziness.  Slurred speech.  Abdominal pain,  especially upper abdominal pain.  Convulsions or seizures. Symptoms generally go away after giving birth. How is this diagnosed? There are no screening tests for preeclampsia. Your health care provider will ask you about symptoms and check for signs of preeclampsia during your prenatal visits. You may also have tests that include:  Urine tests.  Blood tests.  Checking your blood pressure.  Monitoring your baby's heart rate.  Ultrasound. How is this treated? You and your health care provider will determine the treatment approach that is best for you. Treatment may include:  Having more frequent prenatal exams to check for signs of preeclampsia, if you have an increased risk for preeclampsia.  Bed rest.  Reducing how much salt (sodium) you eat.  Medicine to lower your blood pressure.  Staying in the hospital, if your condition is severe. There, treatment will focus on controlling your blood pressure and the amount of fluids in your body (fluid retention).  You may need to take medicine (magnesium sulfate) to prevent seizures. This medicine may be given as an injection or through an IV tube.  Delivering your baby early, if your condition gets worse. You may have your labor started with medicine (induced), or you may have a cesarean delivery. Follow these instructions at home: Eating and drinking  Drink enough fluid to keep your urine clear or pale yellow.  Eat a healthy diet that is low in sodium. Do not add salt to your food. Check nutrition labels to see how much sodium a food or beverage contains.  Avoid caffeine. Lifestyle  Do not use any products that contain nicotine or tobacco, such as cigarettes and e-cigarettes. If you need help   Drink enough fluid to keep your urine clear or pale yellow.  · Eat a healthy diet that is low in sodium. Do not add salt to your food. Check nutrition labels to see how much sodium a food or beverage contains.  · Avoid caffeine.  Lifestyle   · Do not use any products that contain nicotine or tobacco, such as cigarettes and e-cigarettes. If you need help quitting, ask your health care provider.  · Do not use alcohol or drugs.  · Avoid stress as much as possible. Rest and get plenty of sleep.  General instructions   · Take over-the-counter and prescription medicines only as told by  your health care provider.  · When lying down, lie on your side. This keeps pressure off of your baby.  · When sitting or lying down, raise (elevate) your feet. Try putting some pillows underneath your lower legs.  · Exercise regularly. Ask your health care provider what kinds of exercise are best for you.  · Keep all follow-up and prenatal visits as told by your health care provider. This is important.  How is this prevented?  To prevent preeclampsia or eclampsia from developing during another pregnancy:  · Get proper medical care during pregnancy. Your health care provider may be able to prevent preeclampsia or diagnose and treat it early.  · Your health care provider may have you take a low-dose aspirin or a calcium supplement during your next pregnancy.  · You may have tests of your blood pressure and kidney function after giving birth.  · Maintain a healthy weight. Ask your health care provider for help managing weight gain during pregnancy.  · Work with your health care provider to manage any long-term (chronic) health conditions you have, such as diabetes or kidney problems.    Contact a health care provider if:  · You gain more weight than expected.  · You have headaches.  · You have nausea or vomiting.  · You have abdominal pain.  · You feel dizzy or light-headed.  Get help right away if:  · You develop sudden or severe swelling anywhere in your body. This usually happens in the legs.  · You gain 5 lbs (2.3 kg) or more during one week.  · You have severe:  ? Abdominal pain.  ? Headaches.  ? Dizziness.  ? Vision problems.  ? Confusion.  ? Nausea or vomiting.  · You have a seizure.  · You have trouble moving any part of your body.  · You develop numbness in any part of your body.  · You have trouble speaking.  · You have any abnormal bleeding.  · You pass out.  This information is not intended to replace advice given to you by your health care provider. Make sure you discuss any questions you have with your  health care provider.  Document Released: 11/25/2000 Document Revised: 07/26/2016 Document Reviewed: 07/04/2016  Elsevier Interactive Patient Education © 2017 [employer]

## 2016-12-23 NOTE — Progress Notes (Signed)
   PRENATAL VISIT NOTE  Subjective:  Melissa Quinn is a 31 y.o. [phone] at [redacted]w[redacted]d being seen today for ongoing prenatal care.  She is currently monitored for the following issues for this high-risk pregnancy and has Supervision of high risk pregnancy, antepartum; Chronic hypertension during pregnancy, antepartum; Previous cesarean delivery affecting pregnancy, antepartum; History of severe pre-eclampsia; and Request for sterilization on her problem list.  Patient reports no complaints; denies headache, visual symptoms, RUQ/epigastric pain.  Contractions: Irregular. Vag. Bleeding: None.  Movement: Present. Denies leaking of fluid.   The following portions of the patient's history were reviewed and updated as appropriate: allergies, current medications, past family history, past medical history, past social history, past surgical history and problem list. Problem list updated.  Objective:   Vitals:   12/23/16 1040  BP: 129/87  Pulse: 98  Weight: 160 lb (72.6 kg)    Fetal Status: Fetal Heart Rate (bpm): NST Fundal Height: 36 cm Movement: Present  Presentation: Vertex  General:  Alert, oriented and cooperative. Patient is in no acute distress.  Skin: Skin is warm and dry. No rash noted.   Cardiovascular: Normal heart rate noted  Respiratory: Normal respiratory effort, no problems with respiration noted  Abdomen: Soft, gravid, appropriate for gestational age. Pain/Pressure: Present     Pelvic:  Cervical exam performed Dilation: 1 Effacement (%): Thick Station: Ballotable. Pelvic cultures done.   Extremities: Normal range of motion.  Edema: Trace  Mental Status: Normal mood and affect. Normal behavior. Normal judgment and thought content.  NST performed today was reviewed and was found to be reactive.  12/19/16 [facility] Ultrasound showed EFW 43%, AFI 12 cm   Assessment and Plan:  Pregnancy: [id number] at [redacted]w[redacted]d  1. Chronic hypertension with superimposed preeclampsia Patient was seen in [facility] on  12/13/16 and had one severe range BP and several elevated BP (169/104, 152/99, 146/91, 145/101, 159/94, 142/88, 146/75, 136/77, 132/73) requiring Labetalol. Labs showed normal CBC and CMET but 24 hour urine was 345 mg/spot urine Pr:Cr was 0.25.  Baseline Pr:Cr was 0.07.  Patient meets criteria for superimposed preeclampsia, no severe features.  Also has history of severe preeclampsia last pregnancy. - CBC - Comprehensive metabolic panel - Protein / creatinine ratio, urine . On Labetalol 200 mg po bid now, will continue for now, normal BP today. Will follow up labs.  IOL to be scheduled at 37 weeks, patient informed. Continue recommended antenatal testing and prenatal care for now. Recommend no further increase in Labetalol.  Preeclampsia precautions reviewed.    2. Supervision of high risk pregnancy, antepartum Pelvic cultures done today.   - GC/Chlamydia probe amp ([place])not at [facility] - Strep Gp B Culture+Rflx Preterm labor symptoms and general obstetric precautions including but not limited to vaginal bleeding, contractions, leaking of fluid and fetal movement were reviewed in detail with the patient. Please refer to After Visit Summary for other counseling recommendations.  Return in about 4 days (around 12/27/2016) for NST only.  OB visit/NST/AFI in one week.   Osborne Oman, MD

## 2016-12-24 LAB — SPECIMEN STATUS REPORT

## 2016-12-26 ENCOUNTER — Encounter (HOSPITAL_COMMUNITY): Payer: Self-pay | Admitting: *Deleted

## 2016-12-26 ENCOUNTER — Telehealth (HOSPITAL_COMMUNITY): Payer: Self-pay | Admitting: *Deleted

## 2016-12-26 LAB — CBC
Hematocrit: 37.5 % (ref 34.0–46.6)
Hemoglobin: 12.1 g/dL (ref 11.1–15.9)
MCH: 29.7 pg (ref 26.6–33.0)
MCHC: 32.3 g/dL (ref 31.5–35.7)
MCV: 92 fL (ref 79–97)
Platelets: 261 10*3/uL (ref 150–379)
RBC: 4.07 x10E6/uL (ref 3.77–5.28)
RDW: 15 % (ref 12.3–15.4)
WBC: 8.1 10*3/uL (ref 3.4–10.8)

## 2016-12-26 LAB — COMPREHENSIVE METABOLIC PANEL
ALK PHOS: 147 IU/L — AB (ref 39–117)
ALT: 19 IU/L (ref 0–32)
AST: 26 IU/L (ref 0–40)
Albumin/Globulin Ratio: 1.3 (ref 1.2–2.2)
Albumin: 3.5 g/dL (ref 3.5–5.5)
BUN/Creatinine Ratio: 14 (ref 9–23)
BUN: 7 mg/dL (ref 6–20)
CHLORIDE: 102 mmol/L (ref 96–106)
CO2: 17 mmol/L — ABNORMAL LOW (ref 18–29)
Calcium: 9.1 mg/dL (ref 8.7–10.2)
Creatinine, Ser: 0.5 mg/dL — ABNORMAL LOW (ref 0.57–1.00)
GFR calc Af Amer: 150 mL/min/{1.73_m2} (ref >59)
GFR calc non Af Amer: 130 mL/min/{1.73_m2} (ref >59)
GLOBULIN, TOTAL: 2.8 g/dL (ref 1.5–4.5)
Glucose: 126 mg/dL — ABNORMAL HIGH (ref 65–99)
POTASSIUM: 3.9 mmol/L (ref 3.5–5.2)
SODIUM: 139 mmol/L (ref 134–144)
Total Protein: 6.3 g/dL (ref 6.0–8.5)

## 2016-12-26 LAB — PROTEIN / CREATININE RATIO, URINE
CREATININE, UR: 77.8 mg/dL
PROTEIN UR: 32.2 mg/dL
Protein/Creat Ratio: 414 mg/g creat — ABNORMAL HIGH (ref 0–200)

## 2016-12-26 NOTE — Telephone Encounter (Signed)
Preadmission screen  

## 2016-12-27 ENCOUNTER — Ambulatory Visit (INDEPENDENT_AMBULATORY_CARE_PROVIDER_SITE_OTHER): Payer: Self-pay | Admitting: *Deleted

## 2016-12-27 VITALS — BP 130/88 | HR 83 | Wt 160.0 lb

## 2016-12-27 DIAGNOSIS — O119 Pre-existing hypertension with pre-eclampsia, unspecified trimester: Secondary | ICD-10-CM

## 2016-12-27 DIAGNOSIS — O113 Pre-existing hypertension with pre-eclampsia, third trimester: Secondary | ICD-10-CM

## 2016-12-27 LAB — STREP GP B CULTURE+RFLX: Strep Gp B Culture+Rflx: NEGATIVE

## 2016-12-27 NOTE — Progress Notes (Signed)
NST reviewed and reactive.  

## 2016-12-30 ENCOUNTER — Ambulatory Visit (INDEPENDENT_AMBULATORY_CARE_PROVIDER_SITE_OTHER): Payer: Self-pay | Admitting: Obstetrics & Gynecology

## 2016-12-30 VITALS — BP 125/84 | HR 88 | Wt 160.0 lb

## 2016-12-30 DIAGNOSIS — O119 Pre-existing hypertension with pre-eclampsia, unspecified trimester: Secondary | ICD-10-CM

## 2016-12-30 DIAGNOSIS — O0990 Supervision of high risk pregnancy, unspecified, unspecified trimester: Secondary | ICD-10-CM

## 2016-12-30 DIAGNOSIS — Z8759 Personal history of other complications of pregnancy, childbirth and the puerperium: Secondary | ICD-10-CM

## 2016-12-30 DIAGNOSIS — O113 Pre-existing hypertension with pre-eclampsia, third trimester: Secondary | ICD-10-CM

## 2016-12-30 DIAGNOSIS — O34219 Maternal care for unspecified type scar from previous cesarean delivery: Secondary | ICD-10-CM

## 2016-12-30 NOTE — Progress Notes (Signed)
   PRENATAL VISIT NOTE  Subjective:  GRADIE DODY is a 31 y.o. H  [phone] at [redacted]w[redacted]d being seen today for ongoing prenatal care.  She is currently monitored for the following issues for this low-risk pregnancy and has Supervision of high risk pregnancy, antepartum; Chronic hypertension with superimposed preeclampsia; Previous cesarean delivery affecting pregnancy, antepartum; History of severe pre-eclampsia; and Request for sterilization on her problem list.  Patient reports no complaints.   .  .   . Denies leaking of fluid.   The following portions of the patient's history were reviewed and updated as appropriate: allergies, current medications, past family history, past medical history, past social history, past surgical history and problem list. Problem list updated.  Objective:  There were no vitals filed for this visit.  Fetal Status:        Presentation: Vertex  General:  Alert, oriented and cooperative. Patient is in no acute distress.  Skin: Skin is warm and dry. No rash noted.   Cardiovascular: Normal heart rate noted  Respiratory: Normal respiratory effort, no problems with respiration noted  Abdomen: Soft, gravid, appropriate for gestational age.       Pelvic:  Cervical exam performed        Extremities: Normal range of motion.     Mental Status: Normal mood and affect. Normal behavior. Normal judgment and thought content.   Assessment and Plan:  Pregnancy: [id number] at [redacted]w[redacted]d  1. Supervision of high risk pregnancy, antepartum   2. Previous cesarean delivery affecting pregnancy, antepartum - plans for TOLAC  3. History of severe pre-eclampsia - on labetolol, finished baby asa at [redacted] weeks EGA  4. Chronic hypertension with superimposed preeclampsia - continue twice weekly testing  Preterm labor symptoms and general obstetric precautions including but not limited to vaginal bleeding, contractions, leaking of fluid and fetal movement were reviewed in detail with the  patient. Please refer to After Visit Summary for other counseling recommendations.  No Follow-up on file.   Emily Filbert, MD

## 2017-01-02 ENCOUNTER — Inpatient Hospital Stay (HOSPITAL_COMMUNITY): Payer: Medicaid Other | Admitting: Anesthesiology

## 2017-01-02 ENCOUNTER — Inpatient Hospital Stay (HOSPITAL_COMMUNITY): Payer: Medicaid Other | Admitting: Certified Registered Nurse Anesthetist

## 2017-01-02 ENCOUNTER — Encounter (HOSPITAL_COMMUNITY): Payer: Self-pay

## 2017-01-02 ENCOUNTER — Inpatient Hospital Stay (HOSPITAL_COMMUNITY)
Admission: RE | Admit: 2017-01-02 | Discharge: 2017-01-04 | DRG: 767 | Disposition: A | Discharge status: Home / Self Care | Payer: Medicaid Other | Source: Ambulatory Visit | Attending: Obstetrics & Gynecology | Admitting: Obstetrics & Gynecology

## 2017-01-02 ENCOUNTER — Encounter (HOSPITAL_COMMUNITY)
Admission: RE | Disposition: A | Discharge status: Home / Self Care | Payer: Self-pay | Source: Ambulatory Visit | Attending: Obstetrics & Gynecology

## 2017-01-02 VITALS — BP 136/86 | HR 73 | Temp 98.3°F | Resp 18 | Ht 61.0 in | Wt 160.0 lb

## 2017-01-02 DIAGNOSIS — Z302 Encounter for sterilization: Secondary | ICD-10-CM

## 2017-01-02 DIAGNOSIS — Z833 Family history of diabetes mellitus: Secondary | ICD-10-CM

## 2017-01-02 DIAGNOSIS — Z3490 Encounter for supervision of normal pregnancy, unspecified, unspecified trimester: Secondary | ICD-10-CM

## 2017-01-02 DIAGNOSIS — Z3A37 37 weeks gestation of pregnancy: Secondary | ICD-10-CM

## 2017-01-02 DIAGNOSIS — O34211 Maternal care for low transverse scar from previous cesarean delivery: Secondary | ICD-10-CM | POA: Diagnosis present

## 2017-01-02 DIAGNOSIS — Z880 Allergy status to penicillin: Secondary | ICD-10-CM

## 2017-01-02 DIAGNOSIS — O1002 Pre-existing essential hypertension complicating childbirth: Secondary | ICD-10-CM | POA: Diagnosis present

## 2017-01-02 DIAGNOSIS — O114 Pre-existing hypertension with pre-eclampsia, complicating childbirth: Secondary | ICD-10-CM | POA: Diagnosis present

## 2017-01-02 DIAGNOSIS — Z8249 Family history of ischemic heart disease and other diseases of the circulatory system: Secondary | ICD-10-CM | POA: Diagnosis not present

## 2017-01-02 HISTORY — PX: TUBAL LIGATION: SHX77

## 2017-01-02 LAB — COMPREHENSIVE METABOLIC PANEL
ALBUMIN: 2.8 g/dL — AB (ref 3.5–5.0)
ALT: 22 U/L (ref 14–54)
ANION GAP: 9 (ref 5–15)
AST: 29 U/L (ref 15–41)
Alkaline Phosphatase: 157 U/L — ABNORMAL HIGH (ref 38–126)
BILIRUBIN TOTAL: 0.5 mg/dL (ref 0.3–1.2)
BUN: 9 mg/dL (ref 6–20)
CO2: 22 mmol/L (ref 22–32)
Calcium: 8.9 mg/dL (ref 8.9–10.3)
Chloride: 105 mmol/L (ref 101–111)
Creatinine, Ser: 0.48 mg/dL (ref 0.44–1.00)
Glucose, Bld: 78 mg/dL (ref 65–99)
POTASSIUM: 4 mmol/L (ref 3.5–5.1)
Sodium: 136 mmol/L (ref 135–145)
Total Protein: 6.1 g/dL — ABNORMAL LOW (ref 6.5–8.1)

## 2017-01-02 LAB — CBC
HEMATOCRIT: 35.7 % — AB (ref 36.0–46.0)
Hemoglobin: 12.2 g/dL (ref 12.0–15.0)
MCH: 30.5 pg (ref 26.0–34.0)
MCHC: 34.2 g/dL (ref 30.0–36.0)
MCV: 89.3 fL (ref 78.0–100.0)
PLATELETS: 248 10*3/uL (ref 150–400)
RBC: 4 MIL/uL (ref 3.87–5.11)
RDW: 15 % (ref 11.5–15.5)
WBC: 7.9 10*3/uL (ref 4.0–10.5)

## 2017-01-02 LAB — PROTEIN / CREATININE RATIO, URINE
CREATININE, URINE: 50 mg/dL
PROTEIN CREATININE RATIO: 0.28 mg/mg{creat} — AB (ref 0.00–0.15)
TOTAL PROTEIN, URINE: 14 mg/dL

## 2017-01-02 LAB — TYPE AND SCREEN
ABO/RH(D): O POS
Antibody Screen: NEGATIVE

## 2017-01-02 LAB — GC/CHLAMYDIA PROBE AMP (~~LOC~~) NOT AT ARMC
CHLAMYDIA, DNA PROBE: NEGATIVE
Neisseria Gonorrhea: NEGATIVE

## 2017-01-02 LAB — RPR: RPR Ser Ql: NONREACTIVE

## 2017-01-02 SURGERY — LIGATION, FALLOPIAN TUBE, POSTPARTUM
Anesthesia: Epidural | Site: Abdomen | Laterality: Bilateral

## 2017-01-02 MED ORDER — LIDOCAINE HCL (PF) 1 % IJ SOLN
INTRAMUSCULAR | Status: DC | PRN
  Administered 2017-01-02: 4 mL
  Administered 2017-01-02: 6 mL via EPIDURAL

## 2017-01-02 MED ORDER — ACETAMINOPHEN 325 MG PO TABS
650.0000 mg | ORAL_TABLET | ORAL | Status: DC | PRN
  Administered 2017-01-03 (×2): 650 mg via ORAL
  Filled 2017-01-02 (×2): qty 2

## 2017-01-02 MED ORDER — ZOLPIDEM TARTRATE 5 MG PO TABS: 5.0000 mg | ORAL_TABLET | Freq: Every evening | ORAL | Status: DC | PRN

## 2017-01-02 MED ORDER — EPHEDRINE 5 MG/ML INJ: 10.0000 mg | INTRAVENOUS | Status: DC | PRN

## 2017-01-02 MED ORDER — PHENYLEPHRINE 40 MCG/ML (10ML) SYRINGE FOR IV PUSH (FOR BLOOD PRESSURE SUPPORT)
80.0000 ug | PREFILLED_SYRINGE | INTRAVENOUS | Status: DC | PRN
  Filled 2017-01-02: qty 10

## 2017-01-02 MED ORDER — OXYTOCIN 40 UNITS IN LACTATED RINGERS INFUSION - SIMPLE MED
1.0000 m[IU]/min | INTRAVENOUS | Status: DC
  Administered 2017-01-02: 4 m[IU]/min via INTRAVENOUS
  Administered 2017-01-02: 6 m[IU]/min via INTRAVENOUS
  Administered 2017-01-02: 8 m[IU]/min via INTRAVENOUS
  Administered 2017-01-02: 2 m[IU]/min via INTRAVENOUS

## 2017-01-02 MED ORDER — OXYCODONE-ACETAMINOPHEN 5-325 MG PO TABS: 1.0000 | ORAL_TABLET | ORAL | Status: DC | PRN

## 2017-01-02 MED ORDER — PHENYLEPHRINE 40 MCG/ML (10ML) SYRINGE FOR IV PUSH (FOR BLOOD PRESSURE SUPPORT): 80.0000 ug | PREFILLED_SYRINGE | INTRAVENOUS | Status: DC | PRN

## 2017-01-02 MED ORDER — ACETAMINOPHEN 325 MG PO TABS: 650.0000 mg | ORAL_TABLET | ORAL | Status: DC | PRN

## 2017-01-02 MED ORDER — TETANUS-DIPHTH-ACELL PERTUSSIS 5-2.5-18.5 LF-MCG/0.5 IM SUSP: 0.5000 mL | Freq: Once | INTRAMUSCULAR | Status: DC

## 2017-01-02 MED ORDER — FENTANYL 2.5 MCG/ML BUPIVACAINE 1/10 % EPIDURAL INFUSION (WH - ANES)
14.0000 mL/h | INTRAMUSCULAR | Status: DC | PRN
  Administered 2017-01-02: 14 mL/h via EPIDURAL
  Filled 2017-01-02: qty 100

## 2017-01-02 MED ORDER — OXYCODONE-ACETAMINOPHEN 5-325 MG PO TABS: 2.0000 | ORAL_TABLET | ORAL | Status: DC | PRN

## 2017-01-02 MED ORDER — TRIAMCINOLONE ACETONIDE 40 MG/ML IJ SUSP
40.0000 mg | Freq: Once | INTRAMUSCULAR | Status: AC
  Administered 2017-01-02: 40 mg via INTRAMUSCULAR
  Filled 2017-01-02: qty 1

## 2017-01-02 MED ORDER — BUPIVACAINE HCL (PF) 0.25 % IJ SOLN
INTRAMUSCULAR | Status: AC
  Filled 2017-01-02: qty 30

## 2017-01-02 MED ORDER — LACTATED RINGERS IV SOLN: 500.0000 mL | INTRAVENOUS | Status: DC | PRN

## 2017-01-02 MED ORDER — OXYTOCIN 40 UNITS IN LACTATED RINGERS INFUSION - SIMPLE MED
2.5000 [IU]/h | INTRAVENOUS | Status: DC
  Filled 2017-01-02: qty 1000

## 2017-01-02 MED ORDER — BENZOCAINE-MENTHOL 20-0.5 % EX AERO: 1.0000 "application " | INHALATION_SPRAY | CUTANEOUS | Status: DC | PRN

## 2017-01-02 MED ORDER — FENTANYL CITRATE (PF) 100 MCG/2ML IJ SOLN
25.0000 ug | INTRAMUSCULAR | Status: DC | PRN
  Administered 2017-01-02: 50 ug via INTRAVENOUS

## 2017-01-02 MED ORDER — DIPHENHYDRAMINE HCL 50 MG/ML IJ SOLN: 12.5000 mg | INTRAMUSCULAR | Status: DC | PRN

## 2017-01-02 MED ORDER — COCONUT OIL OIL: 1.0000 "application " | TOPICAL_OIL | Status: DC | PRN

## 2017-01-02 MED ORDER — IBUPROFEN 600 MG PO TABS
600.0000 mg | ORAL_TABLET | Freq: Four times a day (QID) | ORAL | Status: DC
  Administered 2017-01-02 – 2017-01-04 (×7): 600 mg via ORAL
  Filled 2017-01-02 (×7): qty 1

## 2017-01-02 MED ORDER — LISINOPRIL 10 MG PO TABS
10.0000 mg | ORAL_TABLET | Freq: Every day | ORAL | Status: DC
  Administered 2017-01-03 – 2017-01-04 (×2): 10 mg via ORAL
  Filled 2017-01-02 (×2): qty 1

## 2017-01-02 MED ORDER — LACTATED RINGERS IV SOLN
INTRAVENOUS | Status: DC
  Administered 2017-01-02: 08:00:00 via INTRAVENOUS

## 2017-01-02 MED ORDER — LACTATED RINGERS IV SOLN
INTRAVENOUS | Status: DC | PRN
  Administered 2017-01-02: 15:00:00 via INTRAVENOUS

## 2017-01-02 MED ORDER — SENNOSIDES-DOCUSATE SODIUM 8.6-50 MG PO TABS
2.0000 | ORAL_TABLET | ORAL | Status: DC
  Administered 2017-01-03 (×2): 2 via ORAL
  Filled 2017-01-02 (×2): qty 2

## 2017-01-02 MED ORDER — DIPHENHYDRAMINE HCL 25 MG PO CAPS: 25.0000 mg | ORAL_CAPSULE | Freq: Four times a day (QID) | ORAL | Status: DC | PRN

## 2017-01-02 MED ORDER — ONDANSETRON HCL 4 MG/2ML IJ SOLN: 4.0000 mg | INTRAMUSCULAR | Status: DC | PRN

## 2017-01-02 MED ORDER — SODIUM BICARBONATE 8.4 % IV SOLN
INTRAVENOUS | Status: DC | PRN
  Administered 2017-01-02: 5 mL via EPIDURAL
  Administered 2017-01-02: 3 mL via EPIDURAL
  Administered 2017-01-02: 5 mL via EPIDURAL

## 2017-01-02 MED ORDER — LIDOCAINE HCL (PF) 1 % IJ SOLN
30.0000 mL | INTRAMUSCULAR | Status: DC | PRN
  Filled 2017-01-02: qty 30

## 2017-01-02 MED ORDER — PRENATAL MULTIVITAMIN CH
1.0000 | ORAL_TABLET | Freq: Every day | ORAL | Status: DC
  Administered 2017-01-03: 1 via ORAL
  Filled 2017-01-02: qty 1

## 2017-01-02 MED ORDER — FENTANYL CITRATE (PF) 100 MCG/2ML IJ SOLN
INTRAMUSCULAR | Status: AC
  Administered 2017-01-02: 50 ug via INTRAVENOUS
  Filled 2017-01-02: qty 2

## 2017-01-02 MED ORDER — ONDANSETRON HCL 4 MG/2ML IJ SOLN: 4.0000 mg | Freq: Four times a day (QID) | INTRAMUSCULAR | Status: DC | PRN

## 2017-01-02 MED ORDER — DIBUCAINE 1 % RE OINT: 1.0000 "application " | TOPICAL_OINTMENT | RECTAL | Status: DC | PRN

## 2017-01-02 MED ORDER — TERBUTALINE SULFATE 1 MG/ML IJ SOLN
0.2500 mg | Freq: Once | INTRAMUSCULAR | Status: DC | PRN
  Filled 2017-01-02: qty 1

## 2017-01-02 MED ORDER — WITCH HAZEL-GLYCERIN EX PADS: 1.0000 "application " | MEDICATED_PAD | CUTANEOUS | Status: DC | PRN

## 2017-01-02 MED ORDER — FENTANYL CITRATE (PF) 100 MCG/2ML IJ SOLN
100.0000 ug | INTRAMUSCULAR | Status: DC | PRN
  Filled 2017-01-02: qty 2

## 2017-01-02 MED ORDER — FLEET ENEMA 7-19 GM/118ML RE ENEM: 1.0000 | ENEMA | RECTAL | Status: DC | PRN

## 2017-01-02 MED ORDER — LABETALOL HCL 200 MG PO TABS
200.0000 mg | ORAL_TABLET | Freq: Two times a day (BID) | ORAL | Status: DC
  Administered 2017-01-02: 200 mg via ORAL
  Filled 2017-01-02 (×2): qty 2

## 2017-01-02 MED ORDER — SIMETHICONE 80 MG PO CHEW
80.0000 mg | CHEWABLE_TABLET | ORAL | Status: DC | PRN
  Administered 2017-01-03: 80 mg via ORAL
  Filled 2017-01-02: qty 1

## 2017-01-02 MED ORDER — OXYTOCIN BOLUS FROM INFUSION
500.0000 mL | Freq: Once | INTRAVENOUS | Status: AC
  Administered 2017-01-02: 500 mL via INTRAVENOUS

## 2017-01-02 MED ORDER — BUPIVACAINE HCL (PF) 0.25 % IJ SOLN
INTRAMUSCULAR | Status: DC | PRN
  Administered 2017-01-02: 10 mL

## 2017-01-02 MED ORDER — ONDANSETRON HCL 4 MG PO TABS: 4.0000 mg | ORAL_TABLET | ORAL | Status: DC | PRN

## 2017-01-02 MED ORDER — SOD CITRATE-CITRIC ACID 500-334 MG/5ML PO SOLN
30.0000 mL | ORAL | Status: DC | PRN
  Administered 2017-01-02: 30 mL via ORAL
  Filled 2017-01-02: qty 15

## 2017-01-02 MED ORDER — LACTATED RINGERS IV SOLN
500.0000 mL | Freq: Once | INTRAVENOUS | Status: AC
  Administered 2017-01-02: 500 mL via INTRAVENOUS

## 2017-01-02 SURGICAL SUPPLY — 25 items
ADH SKN CLS APL DERMABOND .7 (GAUZE/BANDAGES/DRESSINGS) ×1
BLADE SURG 11 STRL SS (BLADE) ×3 IMPLANT
CLIP FILSHIE TUBAL LIGA STRL (Clip) ×3 IMPLANT
CLOTH BEACON ORANGE TIMEOUT ST (SAFETY) ×3 IMPLANT
DERMABOND ADVANCED (GAUZE/BANDAGES/DRESSINGS) ×2
DERMABOND ADVANCED .7 DNX12 (GAUZE/BANDAGES/DRESSINGS) IMPLANT
DRSG OPSITE POSTOP 3X4 (GAUZE/BANDAGES/DRESSINGS) ×3 IMPLANT
DURAPREP 26ML APPLICATOR (WOUND CARE) ×3 IMPLANT
GLOVE BIO SURGEON STRL SZ 6.5 (GLOVE) ×2 IMPLANT
GLOVE BIO SURGEONS STRL SZ 6.5 (GLOVE) ×1
GLOVE BIOGEL PI IND STRL 7.0 (GLOVE) ×2 IMPLANT
GLOVE BIOGEL PI INDICATOR 7.0 (GLOVE) ×4
GOWN STRL REUS W/TWL LRG LVL3 (GOWN DISPOSABLE) ×6 IMPLANT
NEEDLE HYPO 22GX1.5 SAFETY (NEEDLE) ×3 IMPLANT
NS IRRIG 1000ML POUR BTL (IV SOLUTION) ×3 IMPLANT
PACK ABDOMINAL MINOR (CUSTOM PROCEDURE TRAY) ×3 IMPLANT
PROTECTOR NERVE ULNAR (MISCELLANEOUS) ×3 IMPLANT
SPONGE LAP 4X18 X RAY DECT (DISPOSABLE) ×2 IMPLANT
SUT VIC AB 0 CT1 27 (SUTURE) ×3
SUT VIC AB 0 CT1 27XBRD ANBCTR (SUTURE) ×1 IMPLANT
SUT VICRYL 4-0 PS2 18IN ABS (SUTURE) ×3 IMPLANT
SYR CONTROL 10ML LL (SYRINGE) ×3 IMPLANT
TOWEL OR 17X24 6PK STRL BLUE (TOWEL DISPOSABLE) ×6 IMPLANT
TRAY FOLEY CATH SILVER 16FR (SET/KITS/TRAYS/PACK) ×3 IMPLANT
WATER STERILE IRR 1000ML POUR (IV SOLUTION) ×3 IMPLANT

## 2017-01-02 NOTE — Anesthesia Postprocedure Evaluation (Addendum)
Anesthesia Post Note  Patient: Melissa Quinn  Procedure(s) Performed: * No procedures listed *  Patient location during evaluation: L&D Anesthesia Type: Epidural Level of consciousness: awake Pain management: satisfactory to patient Vital Signs Assessment: post-procedure vital signs reviewed and stable Respiratory status: spontaneous breathing Cardiovascular status: blood pressure returned to baseline Postop Assessment: no headache and spinal receding Anesthetic complications: no Comments: For BTL        Last Vitals:  Vitals:   01/02/17 1157 01/02/17 1302  BP:    Pulse:    Resp:  18  Temp: 36.8 C     Last Pain:  Vitals:   01/02/17 1302  TempSrc:   PainSc: 0-No pain   Pain Goal:                 Riccardo Dubin

## 2017-01-02 NOTE — H&P (Signed)
LABOR AND DELIVERY ADMISSION HISTORY AND PHYSICAL NOTE  Melissa Quinn is a 31 y.o. female [phone] with IUP at [redacted]w[redacted]d by LMP and confirmed with ultrasound presenting for TOLAC with IOL 2/2 chronic hypertension with superimposed pre-eclampsia.   Currently taking labetalol 200mg  BID for blood pressure control. P:C of 0.41 on 12/23/16. Denies worsening headache, blurry vision, RUQ abdominal pain. Has noticed some worsening bilateral lower and upper extremity edema.   She reports positive fetal movement. She denies leakage of fluid and reports a small amount of spotting with regard to vaginal bleeding. She also thinks she passed her mucus plug this morning.   Prenatal History/Complications: Chronic hypertension Severe pre-eclampsia C-section with last delivery  Past Medical History: Past Medical History:  Diagnosis Date  . Abnormal Pap smear    colpo 2013  . Chlamydia 2011  . Chronic hypertension 09/2013  . Pregnancy induced hypertension     Past Surgical History: Past Surgical History:  Procedure Laterality Date  . CESAREAN SECTION N/A 10/23/2013   Procedure: Primary Cesarean Section Delivery Baby Girl @ 0004, Apgars 8/8;  Surgeon: Jonnie Kind, MD;  Location: [facility];  Service: Obstetrics;  Laterality: N/A;    Obstetrical History: OB History    Gravida Para Term Preterm AB Living   5 4 2 2   4    SAB TAB Ectopic Multiple Live Births           4      Social History: Social History   Social History  . Marital status: Married    Spouse name: N/A  . Number of children: N/A  . Years of education: N/A   Social History Main Topics  . Smoking status: Never Smoker  . Smokeless tobacco: Never Used  . Alcohol use No  . Drug use: No  . Sexual activity: Yes    Birth control/ protection: None   Other Topics Concern  . None   Social History Narrative  . None    Family History: Family History  Problem Relation Age of Onset  . Diabetes Mother   . Hypertension Mother    . Hypertension Sister   . Asthma Son   . Diabetes Maternal Grandmother     Allergies: Allergies  Allergen Reactions  . Penicillins Hives    Has patient had a PCN reaction causing immediate rash, facial/tongue/throat swelling, SOB or lightheadedness with hypotension: Yes Has patient had a PCN reaction causing severe rash involving mucus membranes or skin necrosis: No Has patient had a PCN reaction that required hospitalization Yes Has patient had a PCN reaction occurring within the last 10 years: No If all of the above answers are "NO", then may proceed with Cephalosporin use.     Prescriptions Prior to Admission  Medication Sig Dispense Refill Last Dose  . labetalol (NORMODYNE) 200 MG tablet Take 1 tablet (200 mg total) by mouth 2 (two) times daily. 60 tablet 3 01/01/2017 at 2230  . Prenatal Vit-Fe Fumarate-FA (PRENATAL MULTIVITAMIN) TABS tablet Take 1 tablet by mouth daily at 12 noon.   01/01/2017 at Unknown time  . Doxylamine-Pyridoxine 10-10 MG TBEC Take 2 tabs qhs, after 3 days may increase to 1tab am, 1tab mid-day, 2tabs qhs (Patient not taking: Reported on 01/02/2017) 60 tablet 6 Not Taking at Unknown time     Review of Systems   All systems reviewed and negative except as stated in HPI  Temperature 98.2 F (36.8 C), temperature source Oral, resp. rate 16, height 5\' 1"  (1.549 m), weight  160 lb (72.6 kg), last menstrual period 04/17/2016. General appearance: alert and cooperative Lungs: clear to auscultation bilaterally Heart: regular rate and rhythm Abdomen: soft, non-tender; bowel sounds normal Extremities: No calf swelling or tenderness Presentation: cephalic Fetal monitoring: FHT with baseline in 140s, +accels, - decels, mod variability Uterine activity: irregular Dilation: 2.5 Effacement (%): 80 Station: -2 Exam by:: follmer   Prenatal labs: ABO, Rh: --/--/O POS (01/22 [id number]) Antibody: PENDING (01/22 0815) Rubella: !Error! RPR: NON REAC (11/22 0910)  HBsAg:  NEGATIVE (07/18 1005)  HIV: NONREACTIVE (11/22 0910)  GBS: Negative (01/12 0000)  1 hr Glucola: Normal during 3rd trimester  Genetic screening:  NIPS- low risk Anatomy US: normal  Prenatal Transfer Tool  Maternal Diabetes: No Genetic Screening: Normal Maternal Ultrasounds/Referrals: Normal Fetal Ultrasounds or other Referrals:  None Maternal Substance Abuse:  No Significant Maternal Medications:  Meds include: Other: Labetalol Significant Maternal Lab Results: Lab values include: Group B Strep negative  Results for orders placed or performed during the hospital encounter of 01/02/17 (from the past 24 hour(s))  CBC   Collection Time: 01/02/17  8:15 AM  Result Value Ref Range   WBC 7.9 4.0 - 10.5 K/uL   RBC 4.00 3.87 - 5.11 MIL/uL   Hemoglobin 12.2 12.0 - 15.0 g/dL   HCT 35.7 (L) 36.0 - 46.0 %   MCV 89.3 78.0 - 100.0 fL   MCH 30.5 26.0 - 34.0 pg   MCHC 34.2 30.0 - 36.0 g/dL   RDW 15.0 11.5 - 15.5 %   Platelets 248 150 - 400 K/uL  Type and screen [place]   Collection Time: 01/02/17  8:15 AM  Result Value Ref Range   ABO/RH(D) O POS    Antibody Screen PENDING    Sample Expiration 01/05/2017     Patient Active Problem List   Diagnosis Date Noted  . Pregnancy 01/02/2017  . Request for sterilization 12/23/2016  . History of severe pre-eclampsia 09/22/2016  . Supervision of high risk pregnancy, antepartum 06/28/2016  . Chronic hypertension with superimposed preeclampsia 06/28/2016  . Previous cesarean delivery affecting pregnancy, antepartum 06/28/2016    Assessment: Melissa Quinn is a 31 y.o. [phone] at [redacted]w[redacted]d here for IOL secondary to chronic hypertension with superimposed pre-eclampsia.   #. CHTN+pre-eclampsia: continue to monitor Bps. Resume labetalol as needed. No severe features at this time. Will add on magnesium sulfate if symptoms and pressure require.  #labor: Admitted with plan for pitocin for labor augmentation. Expectant management  otherwise.  #Pain: Patient would like to defer epidural. May consider later in process.  #FWB: Cat I #ID:  GBS negative- no abx indicated #MOF: breast and bottle #MOC: BTL #Circ:  N/A   Melissa Quill, MD PGY-2 01/02/2017, 9:27 AM   OB FELLOW HISTORY AND PHYSICAL ATTESTATION  I have seen and examined this patient; I agree with above documentation in the resident's note.    Katherine Basset, DO OB Fellow 01/02/2017, 9:30 AM

## 2017-01-02 NOTE — Progress Notes (Signed)
Called with patient's blood pressure slightly elevated into the [id number] systolic. No symptoms concerning for severe pre-eclampsia. Patient on labetalol 200mg  BID prior to admission. Given multi-day dosing of labetalol, will transition over to ACE-I in preparation for discharge. Will start on lisinopril 10mg  daily.

## 2017-01-02 NOTE — Anesthesia Pain Management Evaluation Note (Signed)
  CRNA Pain Management Visit Note  Patient: Melissa Quinn, 31 y.o., female  "Hello I am a member of the anesthesia team at [facility]. We have an anesthesia team available at all times to provide care throughout the hospital, including epidural management and anesthesia for C-section. I don't know your plan for the delivery whether it a natural birth, water birth, IV sedation, nitrous supplementation, doula or epidural, but we want to meet your pain goals."   1.Was your pain managed to your expectations on prior hospitalizations?   Yes   2.What is your expectation for pain management during this hospitalization?     Labor support without medications  3.How can we help you reach that goal?   Record the patient's initial score and the patient's pain goal.   Pain: 6  Pain Goal: 10 [facility] wants you to be able to say your pain was always managed very well.  Jabier Mutton 01/02/2017

## 2017-01-02 NOTE — Lactation Note (Signed)
This note was copied from a baby's chart. Lactation Consultation Note  Patient Name: Melissa Quinn [id number] Date: 01/02/2017 Reason for consult: Follow-up assessment;Infant < 6lbs;Other (Comment) (due to the < 6 pounds, Early term - folowing the LPT policy ) Baby is 5 hours old ,in LD with LC assist breast fed 3 mins and was spoon fed 3 ml of EBM.  It has been 3 1/2 hours and due to being less than 6 pounds, Early term infant , LC recommends following the LPT policy. LC explained to mom. Moms feeding preference if to breast / formula . LC explained the benefits of providing breast milk for her baby and since  Mom also is an experienced breast feeding mother LC encouraged to see with hand expressing and pumping will enhance breast milk volume. Mom receptive.  @ this consult - LC reviewed hand expressing , drops from the right and steady flow of 4 ml from the left. [place] set baby in upright position and spoon fed 4 ml / baby tolerated well.  After baby finished baby seemed hungry, so with LC assist latched for 6 mins , with multiple swallows on the left breast , increased with breast compressions. Baby released after 6 mins and fell asleep. LC discussed the size of baby's bellies. Baby STS on moms chest asleep, tucked in her hospital gown.baby also has a hat on.  [place] set up with the DEBP with instructions. For now due to mom being early post BTL , and desires to eat her dinner will need to post pump after the next feeding.    Maternal Data Has patient been taught Hand Expression?: Yes (drops off right breast / 4 ml off the left )  Feeding Feeding Type: Breast Fed Length of feed: 6 min (multiple swallows noted, increased with breast compressions )  LATCH Score/Interventions Latch: Grasps breast easily, tongue down, lips flanged, rhythmical sucking. Intervention(s): Skin to skin;Teach feeding cues;Waking techniques Intervention(s): Adjust position;Assist with latch;Breast massage;Breast  compression  Audible Swallowing: Spontaneous and intermittent  Type of Nipple: Everted at rest and after stimulation  Comfort (Breast/Nipple): Soft / non-tender     Hold (Positioning): Full assist, staff holds infant at breast Intervention(s): Breastfeeding basics reviewed  LATCH Score: 8  Lactation Tools Discussed/Used Tools: Pump Breast pump type: Double-Electric Breast Pump Pump Review: Setup, frequency, and cleaning (mom did not pump after set up , early post BTL , plans to eat and STS for now ) Initiated by:: MAI  Date initiated:: 01/02/17   Consult Status Consult Status: Follow-up Date: 01/03/17 Follow-up type: In-patient    [place] 01/02/2017, 6:12 PM

## 2017-01-02 NOTE — Anesthesia Preprocedure Evaluation (Signed)
Anesthesia Evaluation  Patient identified by MRN, date of birth, ID band Patient awake    Reviewed: Allergy & Precautions, H&P , Patient's Chart, lab work & pertinent test results  Airway Mallampati: II  TM Distance: >3 FB Neck ROM: full    Dental  (+) Teeth Intact   Pulmonary    breath sounds clear to auscultation       Cardiovascular hypertension, On Medications  Rhythm:regular Rate:Normal     Neuro/Psych    GI/Hepatic   Endo/Other    Renal/GU      Musculoskeletal   Abdominal   Peds  Hematology   Anesthesia Other Findings  plts 248 HTN- chronic     Reproductive/Obstetrics (+) Pregnancy                             Anesthesia Physical Anesthesia Plan  ASA: III  Anesthesia Plan: Epidural   Post-op Pain Management:    Induction:   Airway Management Planned:   Additional Equipment:   Intra-op Plan:   Post-operative Plan:   Informed Consent: I have reviewed the patients History and Physical, chart, labs and discussed the procedure including the risks, benefits and alternatives for the proposed anesthesia with the patient or authorized representative who has indicated his/her understanding and acceptance.   Dental Advisory Given  Plan Discussed with:   Anesthesia Plan Comments: (Labs checked- platelets confirmed with RN in room. Fetal heart tracing, per RN, reported to be stable enough for sitting procedure. Discussed epidural, and patient consents to the procedure:  included risk of possible headache,backache, failed block, allergic reaction, and nerve injury. This patient was asked if she had any questions or concerns before the procedure started.)        Anesthesia Quick Evaluation

## 2017-01-02 NOTE — Transfer of Care (Signed)
Immediate Anesthesia Transfer of Care Note  Patient: Melissa Quinn  Procedure(s) Performed: Procedure(s): POST PARTUM TUBAL LIGATION (Bilateral)  Patient Location: PACU  Anesthesia Type:Epidural  Level of Consciousness: awake, alert  and oriented  Airway & Oxygen Therapy: Patient Spontanous Breathing  Post-op Assessment: Report given to RN and Post -op Vital signs reviewed and stable  Post vital signs: Reviewed and stable  Last Vitals:  Vitals:   01/02/17 1302 01/02/17 1415  BP:    Pulse:    Resp: 18   Temp:  37.1 C    Last Pain:  Vitals:   01/02/17 1415  TempSrc: Oral  PainSc: 0-No pain         Complications: No apparent anesthesia complications

## 2017-01-02 NOTE — Progress Notes (Signed)
On admission to [facility] after patient's BTL, blood pressure was 150/94. Patient otherwise asymptomatic with a pain level of "0". At her one hour check blood pressure was 138/80, still asymptomatic and pain level of "3". Notified Dr. Megan Salon.

## 2017-01-02 NOTE — Op Note (Signed)
Melissa Quinn 01/02/2017  PREOPERATIVE DIAGNOSIS:  Multiparity, undesired fertility  POSTOPERATIVE DIAGNOSIS:  Multiparity, undesired fertility  PROCEDURE:  Postpartum Bilateral Tubal Sterilization using Filshie Clips   ANESTHESIA:  Epidural  COMPLICATIONS:  None immediate.  ESTIMATED BLOOD LOSS:  Less than 20 ml.  FLUIDS: 1000 ml LR.  URINE OUTPUT:  50 ml of clear urine.  INDICATIONS: 31 y.o. [id number]  with undesired fertility,status post vaginal delivery, desires permanent sterilization. Risks and benefits of procedure discussed with patient including permanence of method, bleeding, infection, injury to surrounding organs and need for additional procedures. Risk failure of 0.5-1% with increased risk of ectopic gestation if pregnancy occurs was also discussed with patient.   FINDINGS:  Normal uterus, tubes, and ovaries.  TECHNIQUE:  The patient was taken to the operating room where her epidural anesthesia was dosed up to surgical level and found to be adequate.  She was then placed in the dorsal supine position and prepped and draped in sterile fashion.  After an adequate timeout was performed, attention was turned to the patient's abdomen where a small transverse skin incision was made under the umbilical fold. The incision was taken down to the layer of fascia using the scalpel, and fascia was incised, and extended bilaterally using Mayo scissors. The peritoneum was entered in a sharp fashion. Attention was then turned to the patient's uterus, and left fallopian tube was identified and followed out to the fimbriated end.  A Filshie clip was placed on the left fallopian tube about 2 cm from the cornual attachment, with care given to incorporate the underlying mesosalpinx.  A similar process was carried out on the right side allowing for bilateral tubal sterilization.  Good hemostasis was noted overall.  Local analgesia was drizzled on both operative sites.The instruments were then removed  from the patient's abdomen and the fascial incision was repaired with 0 Vicryl, and the skin was closed with a 4-0 Vicryl subcuticular stitch. 1 ml Kenalog with 5 ml of 25% Marcaine was infiltrated subcutaneously to prevent keloid formation. The patient tolerated the procedure well.  Sponge, lap, and needle counts were correct times two.  The patient was then taken to the recovery room awake, extubated and in stable condition.  Emeterio Reeve MD 01/02/2017 3:46 PM

## 2017-01-02 NOTE — Progress Notes (Signed)
31 y.o. [id number] with undesired fertility,status post vaginal delivery, desires permanent sterilization. Risks and benefits of procedure discussed with patient including permanence of method, bleeding, infection, injury to surrounding organs and need for additional procedures. Risk failure of 0.5-1% with increased risk of ectopic gestation if pregnancy occurs was also discussed with patient. Patient verbalized understanding and all questions were answered.  Alvina Filbert Roselie Awkward MD 01/02/2017 2:50 PM

## 2017-01-02 NOTE — Lactation Note (Signed)
This note was copied from a baby's chart. Lactation Consultation Note  Patient Name: Melissa Quinn [id number] Date: 01/02/2017 Reason for consult: Initial assessment   In Labor and Delivery mom was waiting to go back to OR for tubal.  Baby was 5lb 11 oz at delivery and at 37 wk and 1 day old.  Mom has 86, 44, 105, and 31 year old.  48 year old was in NICU and mom pumped and bottle fed.  Mom states that she had too strong of a let down that baby (now 26 year old) did not want to breastfeed.  Mom breast fed 40 year old for 4 months.  Mom was taught hand expression and gtts of colostrum were easily expressed.  Mom was leaned back into laid back position and baby placed on breast.  Strong rhythmic jaw movements noted and swallows were heard with breast massage.  Baby then slipped off and was unable to sustain latch.  Baby was then placed in football position and baby latched for 3 minutes then slipped off and was unable to latch back on.  With assistance baby was then too sleepy to latched.  Baby was placed sts, mom allowed LC to hand express 3 ml of colostrum to give baby.  L and D nurses then came in to take mom back to surgery.  Mom was then taken to OR and baby was spoon fed 3 ml colostrum.  Dad held baby at this time and was then taken to nursery.  Late preterm care and brochures need to be reviewed.  Mom was taken to  OR before LPT care was covered.  Mom wishes to give BM and formula.  LC encouraged mom to BF first then follow with supplementation with formula during breastfeeding.     Maternal Data Has patient been taught Hand Expression?: Yes Does the patient have breastfeeding experience prior to this delivery?: Yes  Feeding Feeding Type: Breast Fed Length of feed: 3 min  LATCH Score/Interventions Latch: Repeated attempts needed to sustain latch, nipple held in mouth throughout feeding, stimulation needed to elicit sucking reflex.  Audible Swallowing: A few with stimulation  Type of Nipple:  Everted at rest and after stimulation  Comfort (Breast/Nipple): Soft / non-tender     Hold (Positioning): Assistance needed to correctly position infant at breast and maintain latch. Intervention(s): Breastfeeding basics reviewed;Support Pillows;Position options;Skin to skin  LATCH Score: 7  Lactation Tools Discussed/Used     Consult Status Consult Status: Follow-up Date: 01/02/17 Follow-up type: In-patient    Ferne Coe [facility] 01/02/2017, 3:12 PM

## 2017-01-02 NOTE — Anesthesia Preprocedure Evaluation (Signed)
Anesthesia Evaluation  Patient identified by MRN, date of birth, ID band Patient awake    Reviewed: Allergy & Precautions, H&P , Patient's Chart, lab work & pertinent test results  Airway Mallampati: II  TM Distance: >3 FB Neck ROM: full    Dental  (+) Teeth Intact   Pulmonary    breath sounds clear to auscultation       Cardiovascular hypertension, On Medications  Rhythm:regular Rate:Normal     Neuro/Psych    GI/Hepatic   Endo/Other    Renal/GU      Musculoskeletal   Abdominal   Peds  Hematology   Anesthesia Other Findings  plts 248 HTN- chronic     Reproductive/Obstetrics (+) Pregnancy                             Anesthesia Physical  Anesthesia Plan  ASA: III  Anesthesia Plan: Epidural   Post-op Pain Management:    Induction:   Airway Management Planned:   Additional Equipment:   Intra-op Plan:   Post-operative Plan:   Informed Consent: I have reviewed the patients History and Physical, chart, labs and discussed the procedure including the risks, benefits and alternatives for the proposed anesthesia with the patient or authorized representative who has indicated his/her understanding and acceptance.   Dental Advisory Given  Plan Discussed with:   Anesthesia Plan Comments: (Labs checked- platelets confirmed with RN in room. Fetal heart tracing, per RN, reported to be stable enough for sitting procedure. Discussed epidural, and patient consents to the procedure:  included risk of possible headache,backache, failed block, allergic reaction, and nerve injury. This patient was asked if she had any questions or concerns before the procedure started.)        Anesthesia Quick Evaluation

## 2017-01-02 NOTE — OR Nursing (Signed)
B\p of 140's-150's over 90's reported to dr Lyndle Herrlich. Pt assymptomatic. Pt has cronic htn. No orders received to treat at this time. Darin Engels rn

## 2017-01-02 NOTE — Anesthesia Postprocedure Evaluation (Signed)
Anesthesia Post Note  Patient: Melissa Quinn  Procedure(s) Performed: Procedure(s) (LRB): POST PARTUM TUBAL LIGATION (Bilateral)  Patient location during evaluation: Mother Baby Anesthesia Type: Epidural Level of consciousness: awake and alert Pain management: pain level controlled Vital Signs Assessment: post-procedure vital signs reviewed and stable Respiratory status: spontaneous breathing and nonlabored ventilation Cardiovascular status: stable Postop Assessment: no headache, patient able to bend at knees, no backache, no signs of nausea or vomiting, adequate PO intake and epidural receding Anesthetic complications: no        Last Vitals:  Vitals:   01/02/17 1702 01/02/17 1817  BP: (!) 150/94 138/80  Pulse: 77 81  Resp: 18 20  Temp: 36.9 C 37 C    Last Pain:  Vitals:   01/02/17 1817  TempSrc: Oral  PainSc: 3    Pain Goal:                 [employer]

## 2017-01-02 NOTE — Anesthesia Procedure Notes (Signed)
Epidural Patient location during procedure: OB Start time: 01/02/2017 12:16 PM End time: 01/02/2017 12:24 PM  Preanesthetic Checklist Completed: patient identified, site marked, surgical consent, pre-op evaluation, timeout performed, IV checked, risks and benefits discussed and monitors and equipment checked  Epidural Patient position: sitting Prep: site prepped and draped and DuraPrep Patient monitoring: continuous pulse ox and blood pressure Approach: midline Location: L3-L4 Injection technique: LOR air  Needle:  Needle type: Tuohy  Needle gauge: 17 G Needle length: 9 cm and 9 Needle insertion depth: 6 cm Catheter type: closed end flexible Catheter size: 19 Gauge Catheter at skin depth: 12 cm Test dose: negative  Assessment Events: blood not aspirated, injection not painful, no injection resistance, negative IV test and no paresthesia  Additional Notes Dosing of Epidural:  1st dose, through catheter ............................................Marland Kitchen  Xylocaine 40 mg  2nd dose, through catheter, after waiting 3 minutes........Marland KitchenXylocaine 60 mg    As each dose occurred, patient was free of IV sx; and patient exhibited no evidence of SA injection.  Patient is more comfortable after epidural dosed. Please see RN's note for documentation of vital signs,and FHR which are stable.  Patient reminded not to try to ambulate with numb legs, and that an RN must be present when she attempts to get up.

## 2017-01-03 ENCOUNTER — Encounter (HOSPITAL_COMMUNITY): Payer: Self-pay | Admitting: Obstetrics & Gynecology

## 2017-01-03 LAB — CBC
HEMATOCRIT: 34 % — AB (ref 36.0–46.0)
Hemoglobin: 11.7 g/dL — ABNORMAL LOW (ref 12.0–15.0)
MCH: 30.5 pg (ref 26.0–34.0)
MCHC: 34.4 g/dL (ref 30.0–36.0)
MCV: 88.8 fL (ref 78.0–100.0)
Platelets: 237 10*3/uL (ref 150–400)
RBC: 3.83 MIL/uL — AB (ref 3.87–5.11)
RDW: 15.4 % (ref 11.5–15.5)
WBC: 11.9 10*3/uL — AB (ref 4.0–10.5)

## 2017-01-03 NOTE — Lactation Note (Signed)
This note was copied from a baby's chart. Lactation Consultation Note  Patient Name: Melissa Quinn [id number] Date: 01/03/2017  Follow up visit made.  Mom states baby is latching to both breasts before she gives formula supplement.  She doesn't want to start pumping.  Discussed giving 10-20 mls as supplement.  Mom declines latch assist.  Encouraged to call with concerns/assist.   Maternal Data    Feeding Feeding Type: Bottle Fed - Formula Nipple Type: Slow - flow  LATCH Score/Interventions                      Lactation Tools Discussed/Used     Consult Status      Ave Filter 01/03/2017, 4:10 PM

## 2017-01-03 NOTE — Progress Notes (Signed)
POSTPARTUM PROGRESS NOTE  Post Partum Day 1  Subjective:  Melissa Quinn is a 31 y.o. [id number] [redacted]w[redacted]d s/p SVD following an IOL 2/2 chronic hypertension with superimposed pre-eclampsia .  No acute events overnight.  Pt denies problems with ambulating, voiding or po intake.  She denies nausea or vomiting.  Pain is well controlled.  She has had flatus. She has not had bowel movement.  Lochia Small.   Blood pressure elevated overnight into the [id number] systolic. She is without headache, vision changes, shortness of breath, RUQ abdominal pain or worsening extremity edema. Patient was on labetalol 200mg  BID, which has been stopped and she has been transitioned to lisinopril 10mg  daily, which she has been on in the past and done well with.   Objective: Blood pressure 138/84, pulse 77, temperature 98.2 F (36.8 C), temperature source Oral, resp. rate 17, height 5\' 1"  (1.549 m), weight 160 lb (72.6 kg), last menstrual period 04/17/2016, SpO2 98 %, unknown if currently breastfeeding.  Physical Exam:  General: alert, cooperative and no distress Lochia:normal flow Chest: CTAB Heart: RRR no m/r/g Abdomen: +BS, soft, nontender. Honeycomb dressing in place. No drainage or discharge surrounding site.  Uterine Fundus: firm, present just inferior to the umbilicus DVT Evaluation: No calf swelling or tenderness Extremities: no lower extremity edema   Recent Labs  01/02/17 0815 01/03/17 0516  HGB 12.2 11.7*  HCT 35.7* 34.0*    Assessment/Plan:  ASSESSMENT: Melissa Quinn is a 31 y.o. [id number] [redacted]w[redacted]d s/p SVD following IOL 2/2 gestational hypertension with superimposed pre-eclampsia. Patient is doing well postpartum. Given elevated blood pressures will continue to monitor overnight one more night.   Patient is also post-op day #1 following a BTL.   Plan for discharge tomorrow   LOS: 1 day   Crissie Sickles, MD PGY-2 01/03/2017, 10:45 AM

## 2017-01-04 MED ORDER — IBUPROFEN 600 MG PO TABS: 600.0000 mg | ORAL_TABLET | Freq: Four times a day (QID) | ORAL | 0 refills | Status: DC

## 2017-01-04 MED ORDER — LISINOPRIL 10 MG PO TABS: 10.0000 mg | ORAL_TABLET | Freq: Every day | ORAL | 0 refills | Status: DC

## 2017-01-04 MED FILL — IBUPROFEN 600 MG TABLET: 600 | 7 days supply | Qty: 30 | Fill #0

## 2017-01-04 MED FILL — LISINOPRIL 10 MG TABLET: 10 | 30 days supply | Qty: 30 | Fill #0

## 2017-01-04 NOTE — Discharge Summary (Signed)
OB Discharge Summary     Patient Name: Melissa Quinn DOB: 12/29/1985 MRN: [id number]  Date of admission: 01/02/2017 Delivering MD: Crissie Sickles C   Date of discharge: 01/04/2017  Admitting diagnosis: INDUCTION desires sterilization Intrauterine pregnancy: [redacted]w[redacted]d     Secondary diagnosis:  Active Problems:   Pregnancy  Additional problems: Requests BTL     Discharge diagnosis: Term Pregnancy Delivered                                                                                                Post partum procedures:Bilateral Tubal Ligation  Augmentation: none  Complications: None  Hospital course:  Onset of Labor With Vaginal Delivery     31 y.o. yo [id number] at [redacted]w[redacted]d was admitted in Latent Labor on 01/02/2017. Patient had an uncomplicated labor course as follows:  Membrane Rupture Time/Date: 12:42 PM ,01/02/2017   Intrapartum Procedures: Episiotomy: None [1]                                         Lacerations:  None [1]  Patient had a delivery of a Viable infant. 01/02/2017  Information for the patient's newborn:  Nylaa, Bullen [id number]  Delivery Method: VBAC, Spontaneous (Filed from Delivery Summary)    Pateint had an uncomplicated postpartum course.  She is ambulating, tolerating a regular diet, passing flatus, and urinating well. Patient is discharged home in stable condition on 01/04/17.   Physical exam  Vitals:   01/03/17 0926 01/03/17 1152 01/03/17 1800 01/04/17 0544  BP: 138/84 140/90 (!) 142/86 136/86  Pulse: 77 78 (!) 102 73  Resp: 17 18 18 18   Temp: 98.2 F (36.8 C) 98 F (36.7 C) 98.4 F (36.9 C) 98.3 F (36.8 C)  TempSrc: Oral Oral Oral Oral  SpO2:      Weight:      Height:       General: alert, cooperative and no distress Lochia: appropriate Uterine Fundus: firm Incision: Healing well with no significant drainage DVT Evaluation: No evidence of DVT seen on physical exam. Labs: Lab Results  Component Value Date   WBC 11.9 (H)  01/03/2017   HGB 11.7 (L) 01/03/2017   HCT 34.0 (L) 01/03/2017   MCV 88.8 01/03/2017   PLT 237 01/03/2017   CMP Latest Ref Rng & Units 01/02/2017  Glucose 65 - 99 mg/dL 78  BUN 6 - 20 mg/dL 9  Creatinine 0.44 - 1.00 mg/dL 0.48  Sodium 135 - 145 mmol/L 136  Potassium 3.5 - 5.1 mmol/L 4.0  Chloride 101 - 111 mmol/L 105  CO2 22 - 32 mmol/L 22  Calcium 8.9 - 10.3 mg/dL 8.9  Total Protein 6.5 - 8.1 g/dL 6.1(L)  Total Bilirubin 0.3 - 1.2 mg/dL 0.5  Alkaline Phos 38 - 126 U/L 157(H)  AST 15 - 41 U/L 29  ALT 14 - 54 U/L 22    Discharge instruction: per After Visit Summary and "Baby and Me Booklet".  After visit meds:  Allergies as of 01/04/2017  Reactions   Penicillins Hives   Has patient had a PCN reaction causing immediate rash, facial/tongue/throat swelling, SOB or lightheadedness with hypotension: Yes Has patient had a PCN reaction causing severe rash involving mucus membranes or skin necrosis: No Has patient had a PCN reaction that required hospitalization Yes Has patient had a PCN reaction occurring within the last 10 years: No If all of the above answers are "NO", then may proceed with Cephalosporin use.      Medication List    TAKE these medications   Doxylamine-Pyridoxine 10-10 MG Tbec Take 2 tabs qhs, after 3 days may increase to 1tab am, 1tab mid-day, 2tabs qhs   ibuprofen 600 MG tablet Commonly known as:  ADVIL,MOTRIN Take 1 tablet (600 mg total) by mouth every 6 (six) hours.   labetalol 200 MG tablet Commonly known as:  NORMODYNE Take 1 tablet (200 mg total) by mouth 2 (two) times daily.   prenatal multivitamin Tabs tablet Take 1 tablet by mouth daily at 12 noon.       Diet: routine diet  Activity: Advance as tolerated. Pelvic rest for 6 weeks.   Outpatient follow up:6 weeks Follow up Appt:Future Appointments Date Time Provider [place]  02/10/2017 10:45 AM Mora Bellman, MD [facility]   Follow up Visit:No Follow-up on  file.  Postpartum contraception: Tubal Ligation  Newborn Data: Live born female  Birth Weight: 5 lb 10.5 oz (2565 g) APGAR: 9, 9  Baby Feeding: Breast Disposition:home with mother   01/04/2017 Hansel Feinstein, CNM

## 2017-01-04 NOTE — Lactation Note (Signed)
This note was copied from a baby's chart. Lactation Consultation Note  Patient Name: Melissa Quinn [id number] Date: 01/04/2017 Reason for consult: Follow-up assessment;Infant < 6lbs;Late preterm infant Follow up visit made prior to discharge.  Mom's breasts are full this AM but not engorged.  Recommended frequent breastfeeding using good breast massaged during feeding.  Mom still states she wants to do both breast and formula.  Discussed supply and demand and recommended she discontinue formula now that milk is in.  Lactation outpatient services and support information reviewed and encouraged prn.  Maternal Data    Feeding Feeding Type: Breast Fed Length of feed: 15 min  LATCH Score/Interventions Latch: Repeated attempts needed to sustain latch, nipple held in mouth throughout feeding, stimulation needed to elicit sucking reflex. Intervention(s): Skin to skin;Teach feeding cues;Waking techniques Intervention(s): Adjust position;Assist with latch;Breast massage;Breast compression  Audible Swallowing: A few with stimulation Intervention(s): Hand expression;Skin to skin  Type of Nipple: Everted at rest and after stimulation  Comfort (Breast/Nipple): Filling, red/small blisters or bruises, mild/mod discomfort  Problem noted: Filling Interventions (Filling): Double electric pump;Massage;Frequent nursing  Hold (Positioning): Assistance needed to correctly position infant at breast and maintain latch. Intervention(s): Breastfeeding basics reviewed;Support Pillows;Position options;Skin to skin  LATCH Score: 6  Lactation Tools Discussed/Used Tools: Pump Breast pump type: Double-Electric Breast Pump   Consult Status Consult Status: Follow-up Date: 01/04/17 Follow-up type: In-patient    Ave Filter 01/04/2017, 8:52 AM

## 2017-01-04 NOTE — Lactation Note (Addendum)
This note was copied from a baby's chart. Lactation Consultation Note Young mom wanting to do things her way. Likes cradle and cross cradle positioning for feeding. Tried football, mom and baby doesn't like it. Used pillows for comfort for positioning w/mom for BF. Moms breast are starting to fill. Encouraged to massage at intervals while BF. Encouraged to BF longer than 10 mins. Encouraged to keep a strict I&O log of baby activity and feedings. Baby swallows.  Mom is supplementing after BF w/Similac 22 cal. Mom is breast bottle. Mom stated she is feeding every 2 hours. encouraged STS while BF. Reported to RN. If MD feels mom can go home, then it's ok by this [place] as long as the importance of not missing BF and supplementing are stressed by MD.  Encouraged pumping for extra stimulation.  Patient Name: Melissa Quinn [id number] Date: 01/04/2017 Reason for consult: Follow-up assessment;Infant < 6lbs;Late preterm infant   Maternal Data    Feeding Feeding Type: Breast Fed Nipple Type: Slow - flow Length of feed: 15 min  LATCH Score/Interventions Latch: Repeated attempts needed to sustain latch, nipple held in mouth throughout feeding, stimulation needed to elicit sucking reflex. Intervention(s): Skin to skin;Teach feeding cues;Waking techniques Intervention(s): Adjust position;Assist with latch;Breast massage;Breast compression  Audible Swallowing: A few with stimulation Intervention(s): Hand expression;Skin to skin  Type of Nipple: Everted at rest and after stimulation  Comfort (Breast/Nipple): Filling, red/small blisters or bruises, mild/mod discomfort  Problem noted: Filling Interventions (Filling): Double electric pump;Massage;Frequent nursing  Hold (Positioning): Assistance needed to correctly position infant at breast and maintain latch. Intervention(s): Breastfeeding basics reviewed;Support Pillows;Position options;Skin to skin  LATCH Score: 6  Lactation Tools  Discussed/Used Tools: Pump Breast pump type: Double-Electric Breast Pump   Consult Status Consult Status: Follow-up Date: 01/04/17 Follow-up type: In-patient    Theodoro Kalata 01/04/2017, 6:56 AM

## 2017-01-04 NOTE — Discharge Instructions (Signed)

## 2017-02-10 ENCOUNTER — Ambulatory Visit (INDEPENDENT_AMBULATORY_CARE_PROVIDER_SITE_OTHER): Payer: Self-pay | Admitting: Obstetrics and Gynecology

## 2017-02-10 MED ORDER — LISINOPRIL 10 MG PO TABS: 10.0000 mg | ORAL_TABLET | Freq: Every day | ORAL | 6 refills | Status: DC

## 2017-02-10 MED ORDER — LISINOPRIL 10 MG PO TABS: 10.0000 mg | ORAL_TABLET | Freq: Every day | ORAL | 0 refills | Status: DC

## 2017-02-10 NOTE — Progress Notes (Signed)
Post Partum Exam  Melissa Quinn is a 31 y.o. [phone] female who presents for a postpartum visit. She is 5 weeks postpartum following a spontaneous vaginal delivery. I have fully reviewed the prenatal and intrapartum course. The delivery was at [redacted]w[redacted]d gestational weeks.  Anesthesia: epidural. Postpartum course has been unremarkable. Baby's course has been unremarkable. Baby is feeding by breast. Bleeding staining only. Bowel function is normal. Bladder function is normal. Patient is not sexually active. Contraception method is tubal ligation. Postpartum depression screening: Neg - Score=4     Review of Systems Pertinent items are noted in HPI.    Objective:  Blood pressure 127/83, pulse 72, height 5\' 1"  (1.549 m), weight 143 lb (64.9 kg), currently breastfeeding.  General:  alert, cooperative and no distress   Breasts:  inspection negative, no nipple discharge or bleeding, no masses or nodularity palpable  Lungs: clear to auscultation bilaterally  Heart:  regular rate and rhythm  Abdomen: soft, non-tender; bowel sounds normal; no masses,  no organomegaly incision: healed   Vulva:  normal  Vagina: normal vagina, no discharge, exudate, lesion, or erythema  Cervix:  multiparous appearance  Corpus: normal size, contour, position, consistency, mobility, non-tender  Adnexa:  normal adnexa  Rectal Exam: Not performed.        Assessment:    Normal postpartum exam. Pap smear not done at today's visit.   Plan:   1. Contraception: tubal ligation 2. Patient is medically cleared to resume all activities of daily living 3. Follow up in: 4 months for annual or as needed.

## 2017-02-24 MED FILL — ?LISINOPRIL 10 MG TABLET: 10 | 30 days supply | Qty: 30 | Fill #0

## 2017-04-26 ENCOUNTER — Encounter: Payer: Self-pay | Admitting: Family Medicine

## 2017-06-23 NOTE — Anesthesia Postprocedure Evaluation (Signed)
Anesthesia Post Note  Patient: Melissa Quinn  Procedure(s) Performed: Procedure(s) (LRB): POST PARTUM TUBAL LIGATION (Bilateral)     Anesthesia Post Evaluation  Last Vitals:  Vitals:   01/03/17 1800 01/04/17 0544  BP: (!) 142/86 136/86  Pulse: (!) 102 73  Resp: 18 18  Temp: 36.9 C 36.8 C    Last Pain:  Vitals:   01/04/17 1007  TempSrc:   PainSc: 0-No pain                 Terryl Molinelli EDWARD

## 2017-06-23 NOTE — Addendum Note (Signed)
Addendum  created 06/23/17 1058 by Delynn Olvera, MD   Sign clinical note    

## 2017-06-27 ENCOUNTER — Encounter: Payer: Self-pay | Admitting: Internal Medicine

## 2017-06-27 ENCOUNTER — Ambulatory Visit: Payer: Self-pay | Attending: Internal Medicine | Admitting: Internal Medicine

## 2017-06-27 VITALS — BP 120/68 | HR 74 | Temp 98.4°F | Resp 16 | Wt 150.4 lb

## 2017-06-27 DIAGNOSIS — R05 Cough: Secondary | ICD-10-CM | POA: Insufficient documentation

## 2017-06-27 DIAGNOSIS — T464X5A Adverse effect of angiotensin-converting-enzyme inhibitors, initial encounter: Secondary | ICD-10-CM

## 2017-06-27 DIAGNOSIS — D3191 Benign neoplasm of unspecified part of right eye: Secondary | ICD-10-CM | POA: Insufficient documentation

## 2017-06-27 DIAGNOSIS — R058 Other specified cough: Secondary | ICD-10-CM

## 2017-06-27 DIAGNOSIS — I10 Essential (primary) hypertension: Secondary | ICD-10-CM | POA: Insufficient documentation

## 2017-06-27 DIAGNOSIS — E663 Overweight: Secondary | ICD-10-CM | POA: Insufficient documentation

## 2017-06-27 DIAGNOSIS — Z880 Allergy status to penicillin: Secondary | ICD-10-CM | POA: Insufficient documentation

## 2017-06-27 DIAGNOSIS — Z6828 Body mass index (BMI) 28.0-28.9, adult: Secondary | ICD-10-CM | POA: Insufficient documentation

## 2017-06-27 DIAGNOSIS — D229 Melanocytic nevi, unspecified: Secondary | ICD-10-CM

## 2017-06-27 MED ORDER — AMLODIPINE BESYLATE 5 MG PO TABS: 5.0000 mg | ORAL_TABLET | Freq: Every day | ORAL | 3 refills | Status: DC

## 2017-06-27 MED FILL — AMLODIPINE BESYLATE 5 MG TA: 5 | 30 days supply | Qty: 30 | Fill #0

## 2017-06-27 NOTE — Progress Notes (Signed)
Patient ID: Melissa Quinn, female    DOB: 28-Oct-1986  MRN: [id number]  CC: Cough; Hypertension; and re-establish   Subjective: Melissa Quinn is a 31 y.o. female who presents to est care with me as PCP. Use to see Dr. Adrian Blackwater. Her concerns today include:  Pt with hx of HTN and obesity.   Pt had baby since last visit in 05/2016. Pt has had tubal ligation. Not breast feeding.  1. HTN -started on Lisinopril at time of delivery. Compliant with Lisinopril -limits salt in foods -no device to check BP -walking QOD for exercise. Plans to get back to Zumba classes at the [facility] -eating better - more vegetables and not a lot of meat  2. Dry cough x 1 mth.  -started as cold with little sore throat and rhinorrhea that resolved after a few days. -no drainage at back of throat, wheezing or GERD symptoms   Patient Active Problem List   Diagnosis Date Noted  . Pregnancy 01/02/2017  . Request for sterilization 12/23/2016  . History of severe pre-eclampsia 09/22/2016  . Supervision of high risk pregnancy, antepartum 06/28/2016  . Chronic hypertension with superimposed preeclampsia 06/28/2016  . Previous cesarean delivery affecting pregnancy, antepartum 06/28/2016     No current outpatient prescriptions on file prior to visit.   No current facility-administered medications on file prior to visit.     Allergies  Allergen Reactions  . Penicillins Hives    Has patient had a PCN reaction causing immediate rash, facial/tongue/throat swelling, SOB or lightheadedness with hypotension: Yes Has patient had a PCN reaction causing severe rash involving mucus membranes or skin necrosis: No Has patient had a PCN reaction that required hospitalization Yes Has patient had a PCN reaction occurring within the last 10 years: No If all of the above answers are "NO", then may proceed with Cephalosporin use.     Social History   Social History  . Marital status: Married    Spouse name: N/A  . Number  of children: N/A  . Years of education: N/A   Occupational History  . Not on file.   Social History Main Topics  . Smoking status: Never Smoker  . Smokeless tobacco: Never Used  . Alcohol use No  . Drug use: No  . Sexual activity: Yes    Birth control/ protection: None   Other Topics Concern  . Not on file   Social History Narrative  . No narrative on file    Family History  Problem Relation Age of Onset  . Diabetes Mother   . Hypertension Mother   . Hypertension Sister   . Asthma Son   . Diabetes Maternal Grandmother     Past Surgical History:  Procedure Laterality Date  . CESAREAN SECTION N/A 10/23/2013   Procedure: Primary Cesarean Section Delivery Baby Girl @ 0004, Apgars 8/8;  Surgeon: Jonnie Kind, MD;  Location: [facility];  Service: Obstetrics;  Laterality: N/A;  . TUBAL LIGATION Bilateral 01/02/2017   Procedure: POST PARTUM TUBAL LIGATION;  Surgeon: Woodroe Mode, MD;  Location: [facility];  Service: Gynecology;  Laterality: Bilateral;    ROS: Review of Systems  Respiratory: Positive for cough. Negative for chest tightness, shortness of breath and wheezing.   Cardiovascular: Negative for chest pain, palpitations and leg swelling.    PHYSICAL EXAM: BP 120/68   Pulse 74   Temp 98.4 F (36.9 C) (Oral)   Resp 16   Wt 150 lb 6.4 oz (68.2 kg)  SpO2 98%   BMI 28.42 kg/m   Wt Readings from Last 3 Encounters:  06/27/17 150 lb 6.4 oz (68.2 kg)  02/10/17 143 lb (64.9 kg)  01/02/17 160 lb (72.6 kg)   Physical Exam General appearance - alert, well appearing, Young Hispanic female and in no distress. Patient speaks English Mental status - alert, oriented to person, place, and time, normal mood, behavior, speech, dress, motor activity, and thought processes Eyes - pupils equal and reactive, extraocular eye movements intact. Small hyperpigmented mole located medial aspect of RT eye Neck - supple, no significant adenopathy Chest - clear to auscultation, no wheezes,  rales or rhonchi, symmetric air entry Heart - normal rate, regular rhythm, normal S1, S2, no murmurs, rubs, clicks or gallops Extremities - peripheral pulses normal, no pedal edema, no clubbing or cyanosis  ASSESSMENT AND PLAN: 1. Essential hypertension 2. Cough due to ACE inhibitor -Blood pressure at goal. However I think the cough may be due to ACE inhibitor. -Discontinue lisinopril. Start Norvasc instead. Patient to see clinical pharmacist in 2 weeks for blood pressure check  3. Over weight -Commended her on healthy eating habits and regular exercise. She is down 10 pounds from her postpartum weight.  4. Benign mole -Patient questioned about the mole seen in her right eye. Patient reports that it has been there since childhood and has not changed and does not impair her vision. Patient was given the opportunity to ask questions.  Patient verbalized understanding of the plan and was able to repeat key elements of the plan.   No orders of the defined types were placed in this encounter.    Requested Prescriptions   Signed Prescriptions Disp Refills  . amLODipine (NORVASC) 5 MG tablet 90 tablet 3    Sig: Take 1 tablet (5 mg total) by mouth daily.    Return in about 4 months (around 10/28/2017).  Karle Plumber, MD, FACP

## 2017-06-27 NOTE — Patient Instructions (Signed)
Give 2 week follow up appointment with Stacy for BP recheck.   Stop Lisinopril. Start Amlodipine (Norvasc) instead for blood pressure control. Continue healthy eating habits and regular exercise.

## 2017-07-11 ENCOUNTER — Ambulatory Visit: Payer: Self-pay | Attending: Internal Medicine | Admitting: Pharmacist

## 2017-07-11 ENCOUNTER — Ambulatory Visit: Payer: Self-pay

## 2017-07-11 VITALS — BP 122/84 | HR 88

## 2017-07-11 DIAGNOSIS — Z79899 Other long term (current) drug therapy: Secondary | ICD-10-CM | POA: Insufficient documentation

## 2017-07-11 DIAGNOSIS — R05 Cough: Secondary | ICD-10-CM | POA: Insufficient documentation

## 2017-07-11 DIAGNOSIS — I10 Essential (primary) hypertension: Secondary | ICD-10-CM

## 2017-07-11 NOTE — Progress Notes (Signed)
   S:    Patient arrives in good spirits.    Presents to the clinic for hypertension evaluation. Patient was referred on 06/27/17 by Dr. Wynetta Emery.  Patient was last seen by Primary Care Provider on 06/27/17.   Patient reports adherence with medications. Patient reports cough has almost completely resolved since stopping the lisinopril  Current BP Medications include:  Amlodipine 5 mg daily  Antihypertensives tried in the past include: lisinopril (cough)  Dietary habits include: eating more vegetables and less meat   O:   Last 3 Office BP readings: BP Readings from Last 3 Encounters:  07/11/17 122/84  06/27/17 120/68  02/10/17 127/83    BMET    Component Value Date/Time   NA 136 01/02/2017 0815   NA 139 12/23/2016 1119   K 4.0 01/02/2017 0815   CL 105 01/02/2017 0815   CO2 22 01/02/2017 0815   GLUCOSE 78 01/02/2017 0815   BUN 9 01/02/2017 0815   BUN 7 12/23/2016 1119   CREATININE 0.48 01/02/2017 0815   CREATININE 0.49 (L) 12/02/2016 0951   CALCIUM 8.9 01/02/2017 0815   GFRNONAA >60 01/02/2017 0815   GFRNONAA >89 12/02/2016 0951   GFRAA >60 01/02/2017 0815   GFRAA >89 12/02/2016 0951    A/P: Hypertension longstanding currently controlled on amlodipine 5 mg daily and cough has greatly improved since stopping lisinopril.  Continued current medications as prescribed.   Results reviewed and written information provided.   Total time in face-to-face counseling 5 minutes.   F/U Clinic Visit with Dr. Wynetta Emery as directed.

## 2017-10-25 MED FILL — AMLODIPINE BESYLATE 5 MG TA: 5 | 30 days supply | Qty: 30 | Fill #1

## 2017-11-24 ENCOUNTER — Encounter: Payer: Self-pay | Admitting: Internal Medicine

## 2017-11-24 ENCOUNTER — Ambulatory Visit: Payer: Self-pay | Attending: Internal Medicine | Admitting: Internal Medicine

## 2017-11-24 VITALS — BP 136/82 | HR 82 | Temp 98.2°F | Resp 18 | Ht 61.0 in | Wt 157.0 lb

## 2017-11-24 DIAGNOSIS — I10 Essential (primary) hypertension: Secondary | ICD-10-CM | POA: Insufficient documentation

## 2017-11-24 DIAGNOSIS — E669 Obesity, unspecified: Secondary | ICD-10-CM | POA: Insufficient documentation

## 2017-11-24 DIAGNOSIS — E663 Overweight: Secondary | ICD-10-CM

## 2017-11-24 DIAGNOSIS — J069 Acute upper respiratory infection, unspecified: Secondary | ICD-10-CM | POA: Insufficient documentation

## 2017-11-24 DIAGNOSIS — Z79899 Other long term (current) drug therapy: Secondary | ICD-10-CM | POA: Insufficient documentation

## 2017-11-24 NOTE — Progress Notes (Signed)
Patient ID: Melissa Quinn, female    DOB: 06-27-1986  MRN: [id number]  CC: Annual Exam   Subjective: Katheleen Stella is a 31 y.o. female who presents for routine f/u. Her concerns today include:  Pt with hx of HTN and obesity.   1. C/O having a cold x 1 wk -cough productive of  green phlegm, rhinorrhea, sneezing and chest congestion. No SOB. Had sore throat but better. No fever. -using Robitussin and some OTC pills called Nex  2. HTN: compliant with Norvasc No CP/LE edema  3. Obesity: does zumba when she can. Has two children ages 69 and 1 who keeps her busy. -eating less. -has changed to eating more white meat.. Drinking more water  Patient Active Problem List   Diagnosis Date Noted  . Essential hypertension 06/27/2017  . Benign mole 06/27/2017  . History of severe pre-eclampsia 09/22/2016  . Chronic hypertension with superimposed preeclampsia 06/28/2016     Current Outpatient Medications on File Prior to Visit  Medication Sig Dispense Refill  . amLODipine (NORVASC) 5 MG tablet Take 1 tablet (5 mg total) by mouth daily. 90 tablet 3   No current facility-administered medications on file prior to visit.     Allergies  Allergen Reactions  . Penicillins Hives    Has patient had a PCN reaction causing immediate rash, facial/tongue/throat swelling, SOB or lightheadedness with hypotension: Yes Has patient had a PCN reaction causing severe rash involving mucus membranes or skin necrosis: No Has patient had a PCN reaction that required hospitalization Yes Has patient had a PCN reaction occurring within the last 10 years: No If all of the above answers are "NO", then may proceed with Cephalosporin use.     Social History   Socioeconomic History  . Marital status: Married    Spouse name: Not on file  . Number of children: Not on file  . Years of education: Not on file  . Highest education level: Not on file  Social Needs  . Financial resource strain: Not on file  .  Food insecurity - worry: Not on file  . Food insecurity - inability: Not on file  . Transportation needs - medical: Not on file  . Transportation needs - non-medical: Not on file  Occupational History  . Not on file  Tobacco Use  . Smoking status: Never Smoker  . Smokeless tobacco: Never Used  Substance and Sexual Activity  . Alcohol use: No  . Drug use: No  . Sexual activity: Yes    Birth control/protection: None  Other Topics Concern  . Not on file  Social History Narrative  . Not on file    Family History  Problem Relation Age of Onset  . Diabetes Mother   . Hypertension Mother   . Hypertension Sister   . Asthma Son   . Diabetes Maternal Grandmother     Past Surgical History:  Procedure Laterality Date  . CESAREAN SECTION N/A 10/23/2013   Procedure: Primary Cesarean Section Delivery Baby Girl @ 0004, Apgars 8/8;  Surgeon: Jonnie Kind, MD;  Location: [facility];  Service: Obstetrics;  Laterality: N/A;  . TUBAL LIGATION Bilateral 01/02/2017   Procedure: POST PARTUM TUBAL LIGATION;  Surgeon: Woodroe Mode, MD;  Location: [facility];  Service: Gynecology;  Laterality: Bilateral;    ROS: Review of Systems As above PHYSICAL EXAM: BP 136/82 (BP Location: Left Arm, Patient Position: Sitting, Cuff Size: Normal)   Pulse 82   Temp 98.2 F (36.8 C) (Oral)  Resp 18   Ht 5\' 1"  (1.549 m)   Wt 157 lb (71.2 kg)   SpO2 99%   BMI 29.66 kg/m   Wt Readings from Last 3 Encounters:  11/24/17 157 lb (71.2 kg)  06/27/17 150 lb 6.4 oz (68.2 kg)  02/10/17 143 lb (64.9 kg)    Physical Exam  General appearance - alert, well appearing, and in no distress. Mild audible congestion Mental status - alert, oriented to person, place, and time, normal mood, behavior, speech, dress, motor activity, and thought processes Nose - normal and patent, no erythema, discharge or polyps Mouth - mucous membranes moist, pharynx normal without lesions Neck - supple, no significant adenopathy Chest -  clear to auscultation, no wheezes, rales or rhonchi, symmetric air entry Heart - normal rate, regular rhythm, normal S1, S2, no murmurs, rubs, clicks or gallops Extremities - peripheral pulses normal, no pedal edema, no clubbing or cyanosis  ASSESSMENT AND PLAN: 1. Upper respiratory tract infection, unspecified type Symptomatic treatment.  Recommend a teaspoon of honey as needed for cough. Can use Vicks VapoRub for congestion.  2. Essential hypertension At goal.  Continue amlodipine.  3. Over weight -Patient will try to fit in exercise activity as she can and do her daily routine. Counseling given on healthy eating habits.   Patient was given the opportunity to ask questions.  Patient verbalized understanding of the plan and was able to repeat key elements of the plan.   No orders of the defined types were placed in this encounter.    Requested Prescriptions    No prescriptions requested or ordered in this encounter    No Follow-up on file.  Karle Plumber, MD, FACP

## 2017-11-24 NOTE — Patient Instructions (Signed)
Use a teaspoon of honey as needed for cough and Vicks Vapor Rub as needed for congestion.   Follow a Healthy Eating Plan - You can do it! Limit sugary drinks.  Avoid sodas, sweet tea, sport or energy drinks, or fruit drinks.  Drink water, lo-fat milk, or diet drinks. Limit snack foods.   Cut back on candy, cake, cookies, chips, ice cream.  These are a special treat, only in small amounts. Eat plenty of vegetables.  Especially dark green, red, and orange vegetables. Aim for at least 3 servings a day. More is better! Include fruit in your daily diet.  Whole fruit is much healthier than fruit juice! Limit "white" bread, "white" pasta, "white" rice.   Choose "100% whole grain" products, brown or wild rice. Avoid fatty meats. Try "Meatless Monday" and choose eggs or beans one day a week.  When eating meat, choose lean meats like chicken, [place], and fish.  Grill, broil, or bake meats instead of frying, and eat poultry without the skin. Eat less salt.  Avoid frozen pizzas, frozen dinners and salty foods.  Use seasonings other than salt in cooking.  This can help blood pressure and keep you from swelling Beer, wine and liquor have calories.  If you can safely drink alcohol, limit to 1 drink per day for women, 2 drinks for men

## 2018-01-01 ENCOUNTER — Encounter: Payer: Self-pay | Admitting: Internal Medicine

## 2018-01-01 ENCOUNTER — Ambulatory Visit: Payer: Self-pay | Attending: Internal Medicine | Admitting: Internal Medicine

## 2018-01-01 VITALS — BP 144/93 | HR 81 | Temp 98.5°F | Resp 16 | Ht <= 58 in | Wt 158.6 lb

## 2018-01-01 DIAGNOSIS — Z9189 Other specified personal risk factors, not elsewhere classified: Secondary | ICD-10-CM

## 2018-01-01 DIAGNOSIS — H6691 Otitis media, unspecified, right ear: Secondary | ICD-10-CM | POA: Insufficient documentation

## 2018-01-01 DIAGNOSIS — I10 Essential (primary) hypertension: Secondary | ICD-10-CM

## 2018-01-01 DIAGNOSIS — H9201 Otalgia, right ear: Secondary | ICD-10-CM | POA: Insufficient documentation

## 2018-01-01 DIAGNOSIS — Z880 Allergy status to penicillin: Secondary | ICD-10-CM | POA: Insufficient documentation

## 2018-01-01 DIAGNOSIS — Z79899 Other long term (current) drug therapy: Secondary | ICD-10-CM | POA: Insufficient documentation

## 2018-01-01 DIAGNOSIS — H6690 Otitis media, unspecified, unspecified ear: Secondary | ICD-10-CM

## 2018-01-01 MED ORDER — AZITHROMYCIN 250 MG PO TABS: ORAL_TABLET | ORAL | 0 refills | Status: DC

## 2018-01-01 MED FILL — AZITHROMYCIN 250 MG TABLET: 250 | 5 days supply | Qty: 6 | Fill #0

## 2018-01-01 NOTE — Progress Notes (Signed)
   Patient ID: EVELIN CAKE, female    DOB: 1986/01/02  MRN: [id number]  CC: RT ear pain Subjective:  Melissa Quinn is a 32 y.o. female who presents for UC visit. Her concerns today include:   1.  C/o pain in RT ear x 3 days. Worse at nights -no drainage Cleans ears with Q-tip occasionally -no dec hearing -takes Tylenol at nights.  Pain 5/10 compared to 8/10 when it started 3 days ago  2.  HTN: she did not take Norvasc as yet for today.    3.  Request dental referral.   Patient Active Problem List   Diagnosis Date Noted  . Essential hypertension 06/27/2017  . Benign mole 06/27/2017  . History of severe pre-eclampsia 09/22/2016  . Chronic hypertension with superimposed preeclampsia 06/28/2016     Current Outpatient Medications on File Prior to Visit  Medication Sig Dispense Refill  . amLODipine (NORVASC) 5 MG tablet Take 1 tablet (5 mg total) by mouth daily. 90 tablet 3   No current facility-administered medications on file prior to visit.     Allergies  Allergen Reactions  . Penicillins Hives    Has patient had a PCN reaction causing immediate rash, facial/tongue/throat swelling, SOB or lightheadedness with hypotension: Yes Has patient had a PCN reaction causing severe rash involving mucus membranes or skin necrosis: No Has patient had a PCN reaction that required hospitalization Yes Has patient had a PCN reaction occurring within the last 10 years: No If all of the above answers are "NO", then may proceed with Cephalosporin use.      ROS: Review of Systems Neg except as above  PHYSICAL EXAM: BP (!) 144/93 (BP Location: Left Arm, Patient Position: Sitting, Cuff Size: Normal)   Pulse 81   Temp 98.5 F (36.9 C) (Oral)   Resp 16   Ht 4\' 9"  (1.448 m)   Wt 158 lb 9.6 oz (71.9 kg)   SpO2 98%   BMI 34.32 kg/m   Physical Exam  General appearance - alert, well appearing, and in no distress Mental status - alert, oriented to person, place, and time, normal mood,  behavior, speech, dress, motor activity, and thought processes Ears - no preauricular LN.  No exudates along canal. RT: slight dull lightt reflex from TM Nose - normal and patent, no erythema, discharge or polyps Mouth - mucous membranes moist, pharynx normal without lesions.  No cavities noted on the RT side  ASSESSMENT AND PLAN: 1. Acute otitis media, unspecified otitis media type -likely clearing given dec pain. - azithromycin (ZITHROMAX) 250 MG tablet; 2 tabs PO x 1 then 1 tab daily  Dispense: 6 tablet; Refill: 0  2. Essential hypertension Advised to take Norvasc as soon as she returns home  3.  Dental Care Dental referral submitted Patient was given the opportunity to ask questions.  Patient verbalized understanding of the plan and was able to repeat key elements of the plan.   No orders of the defined types were placed in this encounter.    Requested Prescriptions   Signed Prescriptions Disp Refills  . azithromycin (ZITHROMAX) 250 MG tablet 6 tablet 0    Sig: 2 tabs PO x 1 then 1 tab daily    Future Appointments  Date Time Provider [place]  01/08/2018 11:00 AM [facility] FINANCIAL COUNSELOR [facility] None    Karle Plumber, MD, Rosalita Chessman

## 2018-01-01 NOTE — Progress Notes (Signed)
Patient is here stating her right eye hurts her. Patient stated it is a needle like pain and spread to her jaw. Patient stated it started since Saturday night.

## 2018-01-01 NOTE — Patient Instructions (Signed)

## 2018-01-08 ENCOUNTER — Ambulatory Visit: Payer: Self-pay | Attending: Internal Medicine

## 2018-01-22 MED FILL — ?AMLODIPINE BESYLATE 5 MG T: 5 MG | 30 days supply | Qty: 30 | Fill #2

## 2018-03-05 ENCOUNTER — Encounter (HOSPITAL_COMMUNITY): Payer: Self-pay | Admitting: Emergency Medicine

## 2018-03-05 ENCOUNTER — Other Ambulatory Visit: Payer: Self-pay

## 2018-03-05 DIAGNOSIS — H5789 Other specified disorders of eye and adnexa: Secondary | ICD-10-CM | POA: Insufficient documentation

## 2018-03-05 DIAGNOSIS — R200 Anesthesia of skin: Secondary | ICD-10-CM | POA: Insufficient documentation

## 2018-03-05 DIAGNOSIS — Z5321 Procedure and treatment not carried out due to patient leaving prior to being seen by health care provider: Secondary | ICD-10-CM | POA: Insufficient documentation

## 2018-03-05 NOTE — ED Triage Notes (Signed)
Pt presents with L eye drainage that began Saturday, no redness, swelling, or itching per patient; also c/o R finger tip numbness and L dorsal hand numbness since Saturday also; pt states shes been with her mom for a few days after she was injured; pt suspects increased stress; pts BP high in triage and she states she takes amlodipine which she has not missed any doses

## 2018-03-06 ENCOUNTER — Emergency Department (HOSPITAL_COMMUNITY): Payer: Self-pay

## 2018-03-06 ENCOUNTER — Emergency Department (HOSPITAL_COMMUNITY)
Admission: EM | Admit: 2018-03-06 | Discharge: 2018-03-06 | Disposition: A | Discharge status: Home / Self Care | Payer: Self-pay | Attending: Emergency Medicine | Admitting: Emergency Medicine

## 2018-03-06 ENCOUNTER — Encounter (HOSPITAL_COMMUNITY): Payer: Self-pay | Admitting: *Deleted

## 2018-03-06 DIAGNOSIS — R202 Paresthesia of skin: Secondary | ICD-10-CM | POA: Insufficient documentation

## 2018-03-06 DIAGNOSIS — E876 Hypokalemia: Secondary | ICD-10-CM | POA: Insufficient documentation

## 2018-03-06 DIAGNOSIS — Z79899 Other long term (current) drug therapy: Secondary | ICD-10-CM | POA: Insufficient documentation

## 2018-03-06 DIAGNOSIS — I10 Essential (primary) hypertension: Secondary | ICD-10-CM | POA: Insufficient documentation

## 2018-03-06 LAB — CBC
HEMATOCRIT: 41.9 % (ref 36.0–46.0)
HEMOGLOBIN: 14.1 g/dL (ref 12.0–15.0)
MCH: 30.5 pg (ref 26.0–34.0)
MCHC: 33.7 g/dL (ref 30.0–36.0)
MCV: 90.7 fL (ref 78.0–100.0)
Platelets: 314 10*3/uL (ref 150–400)
RBC: 4.62 MIL/uL (ref 3.87–5.11)
RDW: 13.2 % (ref 11.5–15.5)
WBC: 8.6 10*3/uL (ref 4.0–10.5)

## 2018-03-06 LAB — BASIC METABOLIC PANEL
Anion gap: 10 (ref 5–15)
BUN: 7 mg/dL (ref 6–20)
CHLORIDE: 104 mmol/L (ref 101–111)
CO2: 26 mmol/L (ref 22–32)
Calcium: 9.1 mg/dL (ref 8.9–10.3)
Creatinine, Ser: 0.64 mg/dL (ref 0.44–1.00)
GFR calc Af Amer: >60 mL/min (ref >60)
GFR calc non Af Amer: >60 mL/min (ref >60)
GLUCOSE: 130 mg/dL — AB (ref 65–99)
POTASSIUM: 3.4 mmol/L — AB (ref 3.5–5.1)
Sodium: 140 mmol/L (ref 135–145)

## 2018-03-06 LAB — I-STAT TROPONIN, ED: Troponin i, poc: 0 ng/mL (ref 0.00–0.08)

## 2018-03-06 LAB — I-STAT BETA HCG BLOOD, ED (MC, WL, AP ONLY)

## 2018-03-06 MED ORDER — POTASSIUM CHLORIDE CRYS ER 20 MEQ PO TBCR: 40.0000 meq | EXTENDED_RELEASE_TABLET | Freq: Once | ORAL | Status: DC

## 2018-03-06 MED FILL — ?AMLODIPINE BESYLATE 5 MG T: 5 MG | 30 days supply | Qty: 30 | Fill #3

## 2018-03-06 NOTE — ED Provider Notes (Signed)
[facility] EMERGENCY DEPARTMENT Provider Note   CSN: [id number] Arrival date & time: 03/06/18  0825     History   Chief Complaint Chief Complaint  Patient presents with  . Eye Drainage  . fingers numb  . Chest Pain    HPI Melissa Quinn is a 32 y.o. female.  HPI  32 y.o. Female with multiple complaints.  States last week felt some tingling in fingers after spending night at hospital with mother.  She also states she had some diffuse headache that was moderate and not like prior migraines (not as severe).  This resolved.  Ths week she had some redness and discharge from her left eye.  She does have several children in the home with the the 4 y.o having some viral uri symptoms.  She has some diffuse stinging and tingling of her skin and feels slightly anxious.  She has recently started a new job.  Her menses are normal and she has had her tubes tied. She currently denies any headache, chest pain, dyspnea, abdominal pain, nausea, vomiting, diarrhea, fever or chills.  She has recently changed her pressure pills.  She was on lisinopril which caused her to cough.  This has resolved with changing.  She has been on her new medication for approximately 6 weeks.   Past Medical History:  Diagnosis Date  . Abnormal Pap smear    colpo 2013  . Chlamydia 2011  . Chronic hypertension 09/2013  . Pregnancy induced hypertension     Patient Active Problem List   Diagnosis Date Noted  . Essential hypertension 06/27/2017  . Benign mole 06/27/2017  . History of severe pre-eclampsia 09/22/2016  . Chronic hypertension with superimposed preeclampsia 06/28/2016    Past Surgical History:  Procedure Laterality Date  . CESAREAN SECTION N/A 10/23/2013   Procedure: Primary Cesarean Section Delivery Baby Girl @ 0004, Apgars 8/8;  Surgeon: Jonnie Kind, MD;  Location: [facility];  Service: Obstetrics;  Laterality: N/A;  . TUBAL LIGATION Bilateral 01/02/2017   Procedure: POST PARTUM  TUBAL LIGATION;  Surgeon: Woodroe Mode, MD;  Location: [facility];  Service: Gynecology;  Laterality: Bilateral;     OB History    Gravida  5   Para  5   Term  3   Preterm  2   AB      Living  5     SAB      TAB      Ectopic      Multiple  0   Live Births  5            Home Medications    Prior to Admission medications   Medication Sig Start Date End Date Taking? Authorizing Provider  amLODipine (NORVASC) 5 MG tablet Take 1 tablet (5 mg total) by mouth daily. 06/27/17   Ladell Pier, MD  azithromycin (ZITHROMAX) 250 MG tablet 2 tabs PO x 1 then 1 tab daily 01/01/18   Ladell Pier, MD    Family History Family History  Problem Relation Age of Onset  . Diabetes Mother   . Hypertension Mother   . Hypertension Sister   . Asthma Son   . Diabetes Maternal Grandmother     Social History Social History   Tobacco Use  . Smoking status: Never Smoker  . Smokeless tobacco: Never Used  Substance Use Topics  . Alcohol use: No  . Drug use: No     Allergies   Penicillins  Review of Systems Review of Systems  All other systems reviewed and are negative.    Physical Exam Updated Vital Signs BP 134/80 (BP Location: Left Arm)   Pulse 79   Temp 98.6 F (37 C) (Oral)   Resp 18   Ht 1.549 m (5\' 1" )   Wt 71.7 kg (158 lb)   LMP  (LMP Unknown) Comment: tubal ligation  SpO2 100%   BMI 29.85 kg/m   Physical Exam  Constitutional: She is oriented to person, place, and time. She appears well-developed and well-nourished.  HENT:  Head: Normocephalic and atraumatic.  Eyes: Pupils are equal, round, and reactive to light. EOM are normal.  Neck: Normal range of motion. Neck supple.  Cardiovascular: Normal rate, regular rhythm and normal pulses.  Pulmonary/Chest: Effort normal and breath sounds normal.  Abdominal: Soft. Bowel sounds are normal.  Neurological: She is alert and oriented to person, place, and time. She has normal strength. She is not  disoriented. No sensory deficit. GCS eye subscore is 4. GCS verbal subscore is 5. GCS motor subscore is 6.  Skin: Capillary refill takes less than 2 seconds.  Nursing note and vitals reviewed.    ED Treatments / Results  Labs (all labs ordered are listed, but only abnormal results are displayed) Labs Reviewed  BASIC METABOLIC PANEL - Abnormal; Notable for the following components:      Result Value   Potassium 3.4 (*)    Glucose, Bld 130 (*)    All other components within normal limits  CBC  I-STAT TROPONIN, ED  I-STAT BETA HCG BLOOD, ED ([facility], [facility], [facility] ONLY)    EKG None  Radiology Dg Chest 2 View  Result Date: 03/06/2018 CLINICAL DATA:  Onset of tingling in the hands 3 days ago, onset of elevated blood pressure and left-sided chest pain yesterday. History of hypertension, preeclampsia. EXAM: CHEST - 2 VIEW COMPARISON:  None in PACs FINDINGS: The lungs are adequately inflated and clear. The heart and mediastinal structures are normal. There is no pleural effusion. The bony thorax is unremarkable. IMPRESSION: There is no active cardiopulmonary disease. Electronically Signed   By: David  Martinique M.D.   On: 03/06/2018 09:22    Procedures Procedures (including critical care time)  Medications Ordered in ED Medications - No data to display   Initial Impression / Assessment and Plan / ED Course  I have reviewed the triage vital signs and the nursing notes.  Pertinent labs & imaging results that were available during my care of the patient were reviewed by me and considered in my medical decision making (see chart for details).   32 year old female with complaints of paresthesias and left eye discharge.  On her exam she has no concerning neurological findings.  Left eye is slightly injected with no discharge at this time.  Given her child's infection events is likely viral.  We discussed return precautions, specifically loss of strength or worsening of symptoms.  She is advised regarding  need for follow-up and voices understanding. She is mildly hypokalemic and was given oral repletion here in the ED.  Final Clinical Impressions(s) / ED Diagnoses   Final diagnoses:  Paresthesias    ED Discharge Orders    None       Pattricia Boss, MD 03/06/18 1350

## 2018-03-06 NOTE — ED Notes (Signed)
Pt stated she is leaving. 

## 2018-03-06 NOTE — ED Notes (Signed)
Pt verbalized understanding discharge instructions and denies any further needs or questions at this time. VS stable, ambulatory and steady gait.   

## 2018-03-06 NOTE — ED Notes (Signed)
Reports tingling sensation in fingers and bilateral feet intermittently. States this may be related to stress from mother being in [place].

## 2018-03-06 NOTE — ED Triage Notes (Addendum)
To ED for eval of left drainage and numbness to fingers for past 3 days. Pt was in ED yesterday but left before being seen. No nausea or vomiting. No visual disturbance. No back or neck pain. No neuro deficits. States she feels pain in left shoulder area as well

## 2018-03-06 NOTE — ED Notes (Signed)
No distress noted.

## 2018-03-06 NOTE — Discharge Instructions (Addendum)
Your potassium is slightly low. Please eat potassium rich food and recheck with your doctor this week.

## 2018-05-02 MED FILL — ?AMLODIPINE BESYLATE 5 MG T: 5 MG | 30 days supply | Qty: 30 | Fill #4

## 2018-06-07 ENCOUNTER — Encounter: Payer: Self-pay | Admitting: Internal Medicine

## 2018-06-07 ENCOUNTER — Ambulatory Visit: Payer: Self-pay | Attending: Internal Medicine | Admitting: Internal Medicine

## 2018-06-07 VITALS — BP 118/78 | HR 71 | Temp 98.9°F | Resp 16 | Wt 155.4 lb

## 2018-06-07 DIAGNOSIS — Z09 Encounter for follow-up examination after completed treatment for conditions other than malignant neoplasm: Secondary | ICD-10-CM | POA: Insufficient documentation

## 2018-06-07 DIAGNOSIS — H748X2 Other specified disorders of left middle ear and mastoid: Secondary | ICD-10-CM | POA: Insufficient documentation

## 2018-06-07 DIAGNOSIS — H7492 Unspecified disorder of left middle ear and mastoid: Secondary | ICD-10-CM

## 2018-06-07 DIAGNOSIS — R6882 Decreased libido: Secondary | ICD-10-CM | POA: Insufficient documentation

## 2018-06-07 DIAGNOSIS — I10 Essential (primary) hypertension: Secondary | ICD-10-CM | POA: Insufficient documentation

## 2018-06-07 DIAGNOSIS — F439 Reaction to severe stress, unspecified: Secondary | ICD-10-CM

## 2018-06-07 DIAGNOSIS — Z79899 Other long term (current) drug therapy: Secondary | ICD-10-CM | POA: Insufficient documentation

## 2018-06-07 DIAGNOSIS — F520 Hypoactive sexual desire disorder: Secondary | ICD-10-CM

## 2018-06-07 NOTE — Progress Notes (Signed)
Patient ID: Melissa Quinn, female    DOB: [birth year]  MRN: [id number]  CC: Hospitalization Follow-up (ED )   Subjective: Melissa Quinn is a 32 y.o. female who presents for routine f/u Her concerns today include:  Pt with hx of HTN  C/o of having a bump behind LT ear x 1 wk.  "Does not bother me but it hurts if I rub it too much."  Thinks it has decreased in size  Wakes up in the middle of the night with hair wet in the front of the head.  Sleeps with temp on 73-74. Sleeps with cover.  Does  Not tie hair up at nights  Feels stress at home.  Has 5 children b/w age 45-to 1.5 yr old.  Mother lives with them and is currently getting treated for breast CA.  She decided to start working part time just to be away from house for several hrs.  This has helped Concern about decrease sexual desire for past 2 yrs. Husband wants to have relations but she is not in the mood.  Relationship is good.  Admits to feeling tired taking care of the children and now working.  Would like referral to dentist.  Would like to get cleaning and see if she needs any fillings done Patient Active Problem List   Diagnosis Date Noted  . Essential hypertension 06/27/2017  . Benign mole 06/27/2017  . History of severe pre-eclampsia 09/22/2016  . Chronic hypertension with superimposed preeclampsia 06/28/2016     Current Outpatient Medications on File Prior to Visit  Medication Sig Dispense Refill  . amLODipine (NORVASC) 5 MG tablet Take 1 tablet (5 mg total) by mouth daily. 90 tablet 3   No current facility-administered medications on file prior to visit.     Allergies  Allergen Reactions  . Penicillins Hives    Has patient had a PCN reaction causing immediate rash, facial/tongue/throat swelling, SOB or lightheadedness with hypotension: Yes Has patient had a PCN reaction causing severe rash involving mucus membranes or skin necrosis: No Has patient had a PCN reaction that required hospitalization Yes Has  patient had a PCN reaction occurring within the last 10 years: No If all of the above answers are "NO", then may proceed with Cephalosporin use.     Social History   Socioeconomic History  . Marital status: Married    Spouse name: Not on file  . Number of children: Not on file  . Years of education: Not on file  . Highest education level: Not on file  Occupational History  . Not on file  Social Needs  . Financial resource strain: Not on file  . Food insecurity:    Worry: Not on file    Inability: Not on file  . Transportation needs:    Medical: Not on file    Non-medical: Not on file  Tobacco Use  . Smoking status: Never Smoker  . Smokeless tobacco: Never Used  Substance and Sexual Activity  . Alcohol use: No  . Drug use: No  . Sexual activity: Yes    Birth control/protection: None  Lifestyle  . Physical activity:    Days per week: Not on file    Minutes per session: Not on file  . Stress: Not on file  Relationships  . Social connections:    Talks on phone: Not on file    Gets together: Not on file    Attends religious service: Not on file    Active member  of club or organization: Not on file    Attends meetings of clubs or organizations: Not on file    Relationship status: Not on file  . Intimate partner violence:    Fear of current or ex partner: Not on file    Emotionally abused: Not on file    Physically abused: Not on file    Forced sexual activity: Not on file  Other Topics Concern  . Not on file  Social History Narrative  . Not on file    Family History  Problem Relation Age of Onset  . Diabetes Mother   . Hypertension Mother   . Hypertension Sister   . Asthma Son   . Diabetes Maternal Grandmother     Past Surgical History:  Procedure Laterality Date  . CESAREAN SECTION N/A 10/23/2013   Procedure: Primary Cesarean Section Delivery Baby Girl @ 0004, Apgars 8/8;  Surgeon: Jonnie Kind, MD;  Location: [facility];  Service: Obstetrics;  Laterality:  N/A;  . TUBAL LIGATION Bilateral 01/02/2017   Procedure: POST PARTUM TUBAL LIGATION;  Surgeon: Woodroe Mode, MD;  Location: [facility];  Service: Gynecology;  Laterality: Bilateral;    ROS: Review of Systems Neg except as above PHYSICAL EXAM: BP 118/78   Pulse 71   Temp 98.9 F (37.2 C) (Oral)   Resp 16   Wt 155 lb 6.4 oz (70.5 kg)   SpO2 95%   BMI 29.36 kg/m   Wt Readings from Last 3 Encounters:  06/07/18 155 lb 6.4 oz (70.5 kg)  03/06/18 158 lb (71.7 kg)  03/05/18 158 lb (71.7 kg)    Physical Exam  General appearance - alert, well appearing, and in no distress Mental status - normal mood, behavior, speech, dress, motor activity, and thought processes Ears - slight prominence of LT mastoid bone.  Both canal and TM wnl. Mouth - mucous membranes moist, pharynx normal without lesions Neck - supple, no significant adenopathy   ASSESSMENT AND PLAN: 1. Stress at home 2. Decreased sexual desire -discuss ways to decrease stress including exercising like going for walks with spouse, treating herself once a wk to something special that she likes -Discuss she is less likely to feel like having sex when she is stressed and tired.  try to set aside one night a wk for date night with spouse.  They have planned to get away for the wkend in the next 1-2 wks to be alone away from kids.  3. Disorder of left mastoid Observe for now   Patient was given the opportunity to ask questions.  Patient verbalized understanding of the plan and was able to repeat key elements of the plan.   No orders of the defined types were placed in this encounter.    Requested Prescriptions    No prescriptions requested or ordered in this encounter    Return if symptoms worsen or fail to improve.  Karle Plumber, MD, FACP

## 2018-06-07 NOTE — Progress Notes (Signed)
    Pt states she has a bump behind her left ear  Pt states she wakes up in the morning and her hair is wet. Pt states she doesn't know if its night sweats

## 2018-06-13 MED FILL — ?AMLODIPINE BESYLATE 5 MG T: 5 MG | 30 days supply | Qty: 30 | Fill #5

## 2018-07-11 ENCOUNTER — Ambulatory Visit: Payer: Self-pay

## 2018-07-13 ENCOUNTER — Ambulatory Visit: Payer: Self-pay

## 2018-07-19 ENCOUNTER — Ambulatory Visit: Payer: Self-pay | Admitting: Internal Medicine

## 2018-07-20 ENCOUNTER — Ambulatory Visit: Payer: Self-pay | Attending: Internal Medicine

## 2018-07-25 ENCOUNTER — Other Ambulatory Visit: Payer: Self-pay | Admitting: Internal Medicine

## 2018-07-25 MED FILL — ?AMLODIPINE BESYLATE 5 MG T: 5 MG | 30 days supply | Qty: 30 | Fill #0

## 2018-08-30 MED FILL — ?AMLODIPINE BESYLATE 5 MG T: 5 MG | 30 days supply | Qty: 30 | Fill #1

## 2018-09-05 ENCOUNTER — Ambulatory Visit: Payer: Self-pay | Attending: Internal Medicine | Admitting: Family Medicine

## 2018-09-05 VITALS — BP 135/77 | HR 76 | Temp 97.9°F | Resp 17 | Ht 59.0 in | Wt 154.0 lb

## 2018-09-05 DIAGNOSIS — Z79899 Other long term (current) drug therapy: Secondary | ICD-10-CM | POA: Insufficient documentation

## 2018-09-05 DIAGNOSIS — J069 Acute upper respiratory infection, unspecified: Secondary | ICD-10-CM | POA: Insufficient documentation

## 2018-09-05 DIAGNOSIS — R05 Cough: Secondary | ICD-10-CM | POA: Insufficient documentation

## 2018-09-05 DIAGNOSIS — Z880 Allergy status to penicillin: Secondary | ICD-10-CM | POA: Insufficient documentation

## 2018-09-05 DIAGNOSIS — I10 Essential (primary) hypertension: Secondary | ICD-10-CM | POA: Insufficient documentation

## 2018-09-05 MED ORDER — IPRATROPIUM BROMIDE 0.03 % NA SOLN: 2.0000 | Freq: Two times a day (BID) | NASAL | 0 refills | Status: DC

## 2018-09-05 MED ORDER — BENZONATATE 100 MG PO CAPS: 100.0000 mg | ORAL_CAPSULE | Freq: Three times a day (TID) | ORAL | 0 refills | Status: DC | PRN

## 2018-09-05 MED ORDER — AZITHROMYCIN 250 MG PO TABS: ORAL_TABLET | ORAL | 0 refills | Status: DC

## 2018-09-05 MED FILL — AZITHROMYCIN 250 MG TABLET: 250 | 5 days supply | Qty: 6 | Fill #0

## 2018-09-05 MED FILL — BENZONATATE 100 MG CAP: 100 | 15 days supply | Qty: 40 | Fill #0

## 2018-09-05 MED FILL — IPRATROPIUM 0.03% SPRAY: 0.03 | 30 days supply | Qty: 30 | Fill #0

## 2018-09-05 NOTE — Progress Notes (Signed)
Patient ID: Melissa Quinn, female    DOB: 1986-03-07, 32 y.o.   MRN: [id number]  PCP: Ladell Pier, MD  Chief Complaint  Patient presents with  . Cough    c/o productive cough, runny nose, hoarse voice x 6 days. coworkers have had similar symptoms at work. has been taking OTC cold & sinus medication with no relief    Subjective:  HPI Melissa Quinn is a 32 y.o. female presents for evaluation URI symptoms.  URI Onset: 6 days Severity: moderate Tried OTC meds without significant relief. Sick exposures at work. History of hypertension only.  Symptoms:  +Nasal congestion discolored nasal mucus.  hoarsenes + URI prodrome with nasal congestion + Minimal swollen neck glands + mild Sinus Headache + mild ear pressure +cough  +chest tightness  No Allergy symptoms, Sore Throat, eye symptoms, chest pain, shortness of breath, or  wheezing  Remainder of Review of Systems negative except as noted in the HPI.   Social History   Socioeconomic History  . Marital status: Married    Spouse name: Not on file  . Number of children: Not on file  . Years of education: Not on file  . Highest education level: Not on file  Occupational History  . Not on file  Social Needs  . Financial resource strain: Not on file  . Food insecurity:    Worry: Not on file    Inability: Not on file  . Transportation needs:    Medical: Not on file    Non-medical: Not on file  Tobacco Use  . Smoking status: Never Smoker  . Smokeless tobacco: Never Used  Substance and Sexual Activity  . Alcohol use: No  . Drug use: No  . Sexual activity: Yes    Birth control/protection: None  Lifestyle  . Physical activity:    Days per week: Not on file    Minutes per session: Not on file  . Stress: Not on file  Relationships  . Social connections:    Talks on phone: Not on file    Gets together: Not on file    Attends religious service: Not on file    Active member of club or organization:  Not on file    Attends meetings of clubs or organizations: Not on file    Relationship status: Not on file  . Intimate partner violence:    Fear of current or ex partner: Not on file    Emotionally abused: Not on file    Physically abused: Not on file    Forced sexual activity: Not on file  Other Topics Concern  . Not on file  Social History Narrative  . Not on file    Family History  Problem Relation Age of Onset  . Diabetes Mother   . Hypertension Mother   . Hypertension Sister   . Asthma Son   . Diabetes Maternal Grandmother    Review of Systems Pertinent negatives listed in HPI Patient Active Problem List   Diagnosis Date Noted  . Essential hypertension 06/27/2017  . Benign mole 06/27/2017  . History of severe pre-eclampsia 09/22/2016  . Chronic hypertension with superimposed preeclampsia 06/28/2016    Allergies  Allergen Reactions  . Penicillins Hives        Prior to Admission medications   Medication Sig Start Date End Date Taking? Authorizing Provider  amLODipine (NORVASC) 5 MG tablet TAKE 1 TABLET BY MOUTH DAILY 07/25/18  Yes Ladell Pier, MD  azithromycin [facility])  250 MG tablet Take 2 tabs PO x 1 dose, then 1 tab PO QD x 4 days 09/05/18   Scot Jun, FNP  benzonatate (TESSALON) 100 MG capsule Take 1-2 capsules (100-200 mg total) by mouth 3 (three) times daily as needed for cough. 09/05/18   Scot Jun, FNP  ipratropium (ATROVENT) 0.03 % nasal spray Place 2 sprays into both nostrils 2 (two) times daily. 09/05/18   Scot Jun, FNP    Past Medical, Surgical Family and Social History reviewed and updated.    Objective:   Today's Vitals   09/05/18 1438  BP: 135/77  Pulse: 76  Resp: 17  Temp: 97.9 F (36.6 C)  TempSrc: Oral  SpO2: 97%  Weight: 154 lb (69.9 kg)  Height: 4\' 11"  (1.499 m)    Wt Readings from Last 3 Encounters:  09/05/18 154 lb (69.9 kg)  06/07/18 155 lb 6.4 oz (70.5 kg)  03/06/18 158 lb (71.7 kg)    Physical Exam General Appearance:    Alert, cooperative, no distress  HENT:  Nasal congestion noted with rhinorrhea present.  Eyes:    PERRL, conjunctiva/corneas clear, EOM's intact       Lungs:     Clear to auscultation bilaterally, respirations unlabored  Heart:    Regular rate and rhythm  Neurologic:   Awake, alert, oriented x 3. No apparent focal neurological           defect.     Assessment & Plan:  1. Upper respiratory tract infection, unspecified type, uncomplicated URI unresponsive to conservative treatment. Will treat with a course of Azithromycin. For cough, benzonatate 100-200 mg up to 3 times per day as needed for cough.   Atrovent nasal spray two sprays per nares twice daily as needed for congestion.  If symptoms worsen or do not improve, return for follow-up, follow-up with PCP, or at the emergency department if severity of symptoms warrant a higher level of care.   Carroll Sage. Kenton Kingfisher, MSN, [facility] [place], [place], [place] [id number] [phone]

## 2018-09-05 NOTE — Patient Instructions (Signed)
Start Azithromycin Take 2 tabs x 1 dose, then 1 tab every day for x 4 days.   Ipratropium (Atrovent) 2 sprays, twice daily. For appropriate administration of the nasal spray, clear the nose, use opposite hand for opposite nare, sniff gently, exhale through your mouth.    Take Benzonatate 100-200 mg 3 times daily as needed.    Hoarseness Hoarseness is any abnormal change in your voice.Hoarseness can make it difficult to speak. Your voice may sound raspy, breathy, or strained. Hoarseness is caused by a problem with the vocal cords. The vocal cords are two bands of tissue inside your voice box (larynx). When you speak, your vocal cords move back and forth to create sound. The surfaces of your vocal cords need to be smooth for your voice to sound clear. Swelling or lumps on the vocal cords can cause hoarseness. Common causes of vocal cord problems include:  Upper airway infection.  A long-term cough.  Straining or overusing your voice.  Smoking.  Allergies.  Vocal cord growths.  Stomach acids that flow up from your stomach and irritate your vocal cords (gastroesophageal reflux).  Follow these instructions at home: Watch your condition for any changes. To ease any discomfort that you feel:  Rest your voice. Do not whisper. Whispering can cause muscle strain.  Do not speak in a loud or harsh voice that makes your hoarseness worse.  Do not use any tobacco products, including cigarettes, chewing tobacco, or electronic cigarettes. If you need help quitting, ask your health care provider.  Avoid secondhand smoke.  Do not eat foods that give you heartburn. Heartburn can make gastroesophageal reflux worse.  Do not drink coffee.  Do not drink alcohol.  Drink enough fluids to keep your urine clear or pale yellow.  Use a humidifier if the air in your home is dry.  Contact a health care provider if:  You have hoarseness that lasts longer than 3 weeks.  You almost lose or  completelylose your voice for longer than 3 days.  You have pain when you swallow or try to talk.  You feel a lump in your neck. Get help right away if:  You have trouble swallowing.  You feel as though you are choking when you swallow.  You cough up blood or vomit blood.  You have trouble breathing. This information is not intended to replace advice given to you by your health care provider. Make sure you discuss any questions you have with your health care provider. Document Released: 11/11/2005 Document Revised: 05/05/2016 Document Reviewed: 11/19/2014 Elsevier Interactive Patient Education  2018 [employer].  Upper Respiratory Infection, Adult Most upper respiratory infections (URIs) are caused by a virus. A URI affects the nose, throat, and upper air passages. The most common type of URI is often called "the common cold." Follow these instructions at home:  Take medicines only as told by your doctor.  Gargle warm saltwater or take cough drops to comfort your throat as told by your doctor.  Use a warm mist humidifier or inhale steam from a shower to increase air moisture. This may make it easier to breathe.  Drink enough fluid to keep your pee (urine) clear or pale yellow.  Eat soups and other clear broths.  Have a healthy diet.  Rest as needed.  Go back to work when your fever is gone or your doctor says it is okay. ? You may need to stay home longer to avoid giving your URI to others. ? You can  also wear a face mask and wash your hands often to prevent spread of the virus.  Use your inhaler more if you have asthma.  Do not use any tobacco products, including cigarettes, chewing tobacco, or electronic cigarettes. If you need help quitting, ask your doctor. Contact a doctor if:  You are getting worse, not better.  Your symptoms are not helped by medicine.  You have chills.  You are getting more short of breath.  You have brown or red mucus.  You have  yellow or brown discharge from your nose.  You have pain in your face, especially when you bend forward.  You have a fever.  You have puffy (swollen) neck glands.  You have pain while swallowing.  You have white areas in the back of your throat. Get help right away if:  You have very bad or constant: ? Headache. ? Ear pain. ? Pain in your forehead, behind your eyes, and over your cheekbones (sinus pain). ? Chest pain.  You have long-lasting (chronic) lung disease and any of the following: ? Wheezing. ? Long-lasting cough. ? Coughing up blood. ? A change in your usual mucus.  You have a stiff neck.  You have changes in your: ? Vision. ? Hearing. ? Thinking. ? Mood. This information is not intended to replace advice given to you by your health care provider. Make sure you discuss any questions you have with your health care provider. Document Released: 05/16/2008 Document Revised: 07/31/2016 Document Reviewed: 03/05/2014 Elsevier Interactive Patient Education  2018 [employer].

## 2018-10-10 MED FILL — ?AMLODIPINE BESYLATE 5 MG T: 5 MG | 30 days supply | Qty: 30 | Fill #2

## 2018-11-01 MED FILL — IBUPROFEN 400 MG TAB: 400 | 5 days supply | Qty: 30 | Fill #0

## 2018-11-01 MED FILL — CHLORHEXIDINE 0.12% RINSE: 0.12 | 15 days supply | Qty: 473 | Fill #0

## 2018-11-01 MED FILL — DEXAMETHASONE 4 MG TABLET: 4 | 3 days supply | Qty: 9 | Fill #0

## 2018-11-01 MED FILL — ?CLINDAMYCIN HCL 300 MG CAP: 300 | 7 days supply | Qty: 21 | Fill #0

## 2018-11-16 ENCOUNTER — Other Ambulatory Visit: Payer: Self-pay

## 2018-11-16 MED ORDER — AMLODIPINE BESYLATE 5 MG PO TABS: 5.0000 mg | ORAL_TABLET | Freq: Every day | ORAL | 2 refills | Status: DC

## 2018-11-16 MED FILL — ?AMLODIPINE BESYLATE 5 MG T: 5 | 30 days supply | Qty: 30 | Fill #0

## 2019-01-16 MED FILL — AMLODIPINE BESYLATE 5 MG TA: 5 | 30 days supply | Qty: 30 | Fill #1

## 2019-01-30 ENCOUNTER — Ambulatory Visit: Payer: Self-pay | Attending: Family Medicine

## 2019-02-22 MED FILL — AMLODIPINE BESYLATE 5 MG TA: 5 | 30 days supply | Qty: 30 | Fill #2

## 2019-03-12 ENCOUNTER — Other Ambulatory Visit: Payer: Self-pay | Admitting: Internal Medicine

## 2019-04-17 ENCOUNTER — Other Ambulatory Visit: Payer: Self-pay | Admitting: Internal Medicine

## 2019-04-17 MED FILL — ?AMLODIPINE BESYLATE 5MG TA: 5 | 10 days supply | Qty: 10 | Fill #0

## 2019-04-26 ENCOUNTER — Other Ambulatory Visit: Payer: Self-pay | Admitting: Internal Medicine

## 2019-06-21 ENCOUNTER — Ambulatory Visit: Payer: Self-pay | Attending: Internal Medicine | Admitting: Internal Medicine

## 2019-06-21 ENCOUNTER — Encounter: Payer: Self-pay | Admitting: Internal Medicine

## 2019-06-21 ENCOUNTER — Other Ambulatory Visit: Payer: Self-pay

## 2019-06-21 VITALS — BP 115/80 | HR 78 | Temp 98.7°F | Resp 18 | Ht 62.0 in | Wt 156.0 lb

## 2019-06-21 DIAGNOSIS — Z0000 Encounter for general adult medical examination without abnormal findings: Secondary | ICD-10-CM

## 2019-06-21 DIAGNOSIS — I10 Essential (primary) hypertension: Secondary | ICD-10-CM

## 2019-06-21 DIAGNOSIS — E663 Overweight: Secondary | ICD-10-CM

## 2019-06-21 DIAGNOSIS — Z124 Encounter for screening for malignant neoplasm of cervix: Secondary | ICD-10-CM

## 2019-06-21 MED ORDER — AMLODIPINE BESYLATE 5 MG PO TABS: 5.0000 mg | ORAL_TABLET | Freq: Every day | ORAL | 6 refills | Status: DC

## 2019-06-21 MED FILL — ?AMLODIPINE BESYLATE 5MG TA: 5 | 30 days supply | Qty: 30 | Fill #0

## 2019-06-21 NOTE — Patient Instructions (Signed)

## 2019-06-21 NOTE — Progress Notes (Signed)
Patient ID: Melissa Quinn, female    DOB: December 23, 1985  MRN: [id number]  CC: Gynecologic Exam   Subjective: Melissa Quinn is a 33 y.o. female who presents for PAP Her concerns today include:   Pt is G5P5 Abnormal in 2013 requiring colpo.  Repeat PAPs have been nl No vaginal dischg or itching Sexually active with one partner Had tubal ligation Menses are regular lasting 4-6 days with light bleeding the last 2 days Felt small ball  in RT nipple a few mths ago associated with erythema of the nipple.  When she squeezed the nipple, she had small amount of milk expressed.  The small ball and erythema resolved after a day or 2.  She has not had any recurrence.  No fhx of uterine or cervical cancer. Fhx of breast cancer in mom dx at age 4.    HYPERTENSION Currently taking: see medication list Med Adherence: [x]  Yes    []  No Medication side effects: []  Yes    [x]  No Adherence with salt restriction: [x]  Yes    []  No Home Monitoring?: []  Yes    [x]  No Monitoring Frequency: []  Yes    []  No Home BP results range: []  Yes    []  No SOB? []  Yes    [x]  No Chest Pain?: []  Yes    [x]  No Leg swelling?: []  Yes    [x]  No Headaches?: []  Yes    [x]  No Dizziness? []  Yes    [x]  No Comments: She reports that she has changed her eating habits significantly.  Snack on fruits instead of junk foods.  Portion sizes are smaller.  More vegetables.  She tries to get out and walk several times a week with her children.   Patient Active Problem List   Diagnosis Date Noted  . Essential hypertension 06/27/2017  . Benign mole 06/27/2017  . History of severe pre-eclampsia 09/22/2016  . Chronic hypertension with superimposed preeclampsia 06/28/2016     Current Outpatient Medications on File Prior to Visit  Medication Sig Dispense Refill  . ipratropium (ATROVENT) 0.03 % nasal spray Place 2 sprays into both nostrils 2 (two) times daily. 30 mL 0   No current facility-administered medications on file  prior to visit.     Allergies  Allergen Reactions  . Penicillins Hives    Has patient had a PCN reaction causing immediate rash, facial/tongue/throat swelling, SOB or lightheadedness with hypotension: Yes Has patient had a PCN reaction causing severe rash involving mucus membranes or skin necrosis: No Has patient had a PCN reaction that required hospitalization Yes Has patient had a PCN reaction occurring within the last 10 years: No If all of the above answers are "NO", then may proceed with Cephalosporin use.     Social History   Socioeconomic History  . Marital status: Married    Spouse name: Not on file  . Number of children: Not on file  . Years of education: Not on file  . Highest education level: Not on file  Occupational History  . Not on file  Social Needs  . Financial resource strain: Not on file  . Food insecurity    Worry: Not on file    Inability: Not on file  . Transportation needs    Medical: Not on file    Non-medical: Not on file  Tobacco Use  . Smoking status: Never Smoker  . Smokeless tobacco: Never Used  Substance and Sexual Activity  . Alcohol use: No  .  Drug use: No  . Sexual activity: Yes    Birth control/protection: None  Lifestyle  . Physical activity    Days per week: Not on file    Minutes per session: Not on file  . Stress: Not on file  Relationships  . Social Herbalist on phone: Not on file    Gets together: Not on file    Attends religious service: Not on file    Active member of club or organization: Not on file    Attends meetings of clubs or organizations: Not on file    Relationship status: Not on file  . Intimate partner violence    Fear of current or ex partner: Not on file    Emotionally abused: Not on file    Physically abused: Not on file    Forced sexual activity: Not on file  Other Topics Concern  . Not on file  Social History Narrative  . Not on file    Family History  Problem Relation Age of Onset   . Diabetes Mother   . Hypertension Mother   . Hypertension Sister   . Asthma Son   . Diabetes Maternal Grandmother     Past Surgical History:  Procedure Laterality Date  . CESAREAN SECTION N/A 10/23/2013   Procedure: Primary Cesarean Section Delivery Baby Girl @ 0004, Apgars 8/8;  Surgeon: Jonnie Kind, MD;  Location: [facility];  Service: Obstetrics;  Laterality: N/A;  . TUBAL LIGATION Bilateral 01/02/2017   Procedure: POST PARTUM TUBAL LIGATION;  Surgeon: Woodroe Mode, MD;  Location: [facility];  Service: Gynecology;  Laterality: Bilateral;    ROS: Review of Systems Negative except as stated above  PHYSICAL EXAM: BP 115/80 (BP Location: Left Arm, Patient Position: Sitting, Cuff Size: Normal)   Pulse 78   Temp 98.7 F (37.1 C) (Oral)   Resp 18   Ht 5\' 2"  (1.575 m)   Wt 156 lb (70.8 kg)   LMP 06/12/2019   SpO2 100%   BMI 28.53 kg/m   Physical Exam  General appearance - alert, well appearing, and in no distress Mental status - normal mood, behavior, speech, dress, motor activity, and thought processes Breasts - breasts appear normal, no suspicious masses, no skin or nipple changes or axillary nodes Pelvic -[facility] [place] present:  normal external genitalia, vulva, vagina, cervix, uterus and adnexa   CMP Latest Ref Rng & Units 03/06/2018 01/02/2017 12/23/2016  Glucose 65 - 99 mg/dL 130(H) 78 126(H)  BUN 6 - 20 mg/dL 7 9 7   Creatinine 0.44 - 1.00 mg/dL 0.64 0.48 0.50(L)  Sodium 135 - 145 mmol/L 140 136 139  Potassium 3.5 - 5.1 mmol/L 3.4(L) 4.0 3.9  Chloride 101 - 111 mmol/L 104 105 102  CO2 22 - 32 mmol/L 26 22 17(L)  Calcium 8.9 - 10.3 mg/dL 9.1 8.9 9.1  Total Protein 6.5 - 8.1 g/dL - 6.1(L) 6.3  Total Bilirubin 0.3 - 1.2 mg/dL - 0.5 <0.2  Alkaline Phos 38 - 126 U/L - 157(H) 147(H)  AST 15 - 41 U/L - 29 26  ALT 14 - 54 U/L - 22 19   Lipid Panel  No results found for: CHOL, TRIG, HDL, CHOLHDL, VLDL, LDLCALC, LDLDIRECT  CBC    Component Value Date/Time   WBC 8.6  03/06/2018 0914   RBC 4.62 03/06/2018 0914   HGB 14.1 03/06/2018 0914   HGB 12.1 12/23/2016 1119   HCT 41.9 03/06/2018 0914   HCT 37.5 12/23/2016 1119  PLT 314 03/06/2018 0914   PLT 261 12/23/2016 1119   MCV 90.7 03/06/2018 0914   MCV 92 12/23/2016 1119   MCH 30.5 03/06/2018 0914   MCHC 33.7 03/06/2018 0914   RDW 13.2 03/06/2018 0914   RDW 15.0 12/23/2016 1119   LYMPHSABS 2,850 06/28/2016 1005   MONOABS 570 06/28/2016 1005   EOSABS 190 06/28/2016 1005   BASOSABS 0 06/28/2016 1005    ASSESSMENT AND PLAN: 1. Pap smear for cervical cancer screening - Cytology - PAP  2. Normal breast exam Advised to be vigilant given that her mother had breast cancer.  She will follow-up if she notices any abnormalities in the breasts again  3. Essential hypertension At goal.  Refill given on amlodipine - CBC - Basic metabolic panel - amLODipine (NORVASC) 5 MG tablet; Take 1 tablet (5 mg total) by mouth daily. MUST KEEP APPT ON 04/26/19 FOR FURTHER REFILLS  Dispense: 30 tablet; Refill: 6  4. Over weight Commended her on changing her eating habits.  Encouraged her to try to get in moderate intensity exercise 3 to 4 days a week for 30 minutes. - Hemoglobin A1c    Patient was given the opportunity to ask questions.  Patient verbalized understanding of the plan and was able to repeat key elements of the plan.   Orders Placed This Encounter  Procedures  . CBC  . Basic metabolic panel  . Hemoglobin A1c     Requested Prescriptions   Signed Prescriptions Disp Refills  . amLODipine (NORVASC) 5 MG tablet 30 tablet 6    Sig: Take 1 tablet (5 mg total) by mouth daily. MUST KEEP APPT ON 04/26/19 FOR FURTHER REFILLS    Return in about 4 months (around 10/22/2019).  Karle Plumber, MD, FACP

## 2019-06-22 LAB — BASIC METABOLIC PANEL
BUN/Creatinine Ratio: 17 (ref 9–23)
BUN: 11 mg/dL (ref 6–20)
CO2: 23 mmol/L (ref 20–29)
Calcium: 9.2 mg/dL (ref 8.7–10.2)
Chloride: 104 mmol/L (ref 96–106)
Creatinine, Ser: 0.66 mg/dL (ref 0.57–1.00)
GFR calc Af Amer: 135 mL/min/{1.73_m2} (ref >59)
GFR calc non Af Amer: 117 mL/min/{1.73_m2} (ref >59)
Glucose: 81 mg/dL (ref 65–99)
Potassium: 4.2 mmol/L (ref 3.5–5.2)
Sodium: 139 mmol/L (ref 134–144)

## 2019-06-22 LAB — CBC
Hematocrit: 37.8 % (ref 34.0–46.6)
Hemoglobin: 12.8 g/dL (ref 11.1–15.9)
MCH: 30.3 pg (ref 26.6–33.0)
MCHC: 33.9 g/dL (ref 31.5–35.7)
MCV: 90 fL (ref 79–97)
Platelets: 305 10*3/uL (ref 150–450)
RBC: 4.22 x10E6/uL (ref 3.77–5.28)
RDW: 13.1 % (ref 11.7–15.4)
WBC: 8.1 10*3/uL (ref 3.4–10.8)

## 2019-06-22 LAB — HEMOGLOBIN A1C
Est. average glucose Bld gHb Est-mCnc: 123 mg/dL
Hgb A1c MFr Bld: 5.9 % — ABNORMAL HIGH (ref 4.8–5.6)

## 2019-06-25 LAB — CYTOLOGY - PAP
Chlamydia: NEGATIVE
Diagnosis: NEGATIVE
HPV: NOT DETECTED
Neisseria Gonorrhea: NEGATIVE
Trichomonas: NEGATIVE

## 2019-08-22 ENCOUNTER — Other Ambulatory Visit: Payer: Self-pay

## 2019-08-22 ENCOUNTER — Ambulatory Visit: Payer: Self-pay | Attending: Internal Medicine

## 2019-09-03 MED FILL — ?AMLODIPINE BESYLATE 5MG TA: 5 | 30 days supply | Qty: 30 | Fill #1

## 2019-09-12 ENCOUNTER — Telehealth: Payer: Self-pay | Admitting: Internal Medicine

## 2019-09-12 NOTE — Telephone Encounter (Signed)
Patient called stating she would like to be prescribed antibiotics for her tooth states she is in pain and has scheduled a dental appt with her dentist for wednesday.  Patient states her dentist believes its an abscess. Please follow up.

## 2019-09-18 ENCOUNTER — Other Ambulatory Visit: Payer: Self-pay

## 2019-09-18 ENCOUNTER — Ambulatory Visit: Payer: Self-pay | Attending: Family Medicine | Admitting: Pharmacist

## 2019-09-18 DIAGNOSIS — Z23 Encounter for immunization: Secondary | ICD-10-CM

## 2019-09-18 MED FILL — CLINDAMYCIN HCL 300 MG CAPS: 300 | 10 days supply | Qty: 40 | Fill #0

## 2019-09-18 NOTE — Progress Notes (Signed)
Patient presents for vaccination against influenza per orders of Dr. Johnson. Consent given. Counseling provided. No contraindications exists. Vaccine administered without incident.   

## 2019-09-20 ENCOUNTER — Ambulatory Visit: Payer: Self-pay | Admitting: Internal Medicine

## 2019-09-24 NOTE — Telephone Encounter (Signed)
Patient kept appointment with dentist

## 2019-10-24 ENCOUNTER — Other Ambulatory Visit: Payer: Self-pay

## 2019-10-24 ENCOUNTER — Ambulatory Visit: Payer: Self-pay | Attending: Internal Medicine | Admitting: Internal Medicine

## 2019-10-24 DIAGNOSIS — R7303 Prediabetes: Secondary | ICD-10-CM

## 2019-10-24 DIAGNOSIS — I10 Essential (primary) hypertension: Secondary | ICD-10-CM

## 2019-10-24 DIAGNOSIS — J3089 Other allergic rhinitis: Secondary | ICD-10-CM

## 2019-10-24 MED ORDER — AMLODIPINE BESYLATE 5 MG PO TABS: 5.0000 mg | ORAL_TABLET | Freq: Every day | ORAL | 6 refills | Status: DC

## 2019-10-24 MED ORDER — LORATADINE 10 MG PO TABS: 10.0000 mg | ORAL_TABLET | Freq: Every day | ORAL | 3 refills | Status: DC

## 2019-10-24 MED ORDER — FLUTICASONE PROPIONATE 50 MCG/ACT NA SUSP: 1.0000 | Freq: Every day | NASAL | 1 refills | Status: DC | PRN

## 2019-10-24 MED FILL — FLUTICASONE PROP 50 MCG SPR: 50 | 30 days supply | Qty: 16 | Fill #0

## 2019-10-24 MED FILL — AMLODIPINE BESYLATE 5 MG TA: 5 | 30 days supply | Qty: 30 | Fill #0

## 2019-10-24 NOTE — Progress Notes (Signed)
Virtual Visit via Telephone Note Due to current restrictions/limitations of in-office visits due to the COVID-19 pandemic, this scheduled clinical appointment was converted to a telehealth visit  I connected with Melissa Quinn on 10/24/19 at 8:36 a.m by telephone and verified that I am speaking with the correct person using two identifiers. I am in my office.  The patient is at home.  Only the patient and myself participated in this encounter.  I discussed the limitations, risks, security and privacy concerns of performing an evaluation and management service by telephone and the availability of in person appointments. I also discussed with the patient that there may be a patient responsible charge related to this service. The patient expressed understanding and agreed to proceed.   History of Present Illness: Pt with hx of HTN, preDM, over  Went over labs from last visit. A1C in range for preDM -use to get up at nights wanting to eat. However, for the past couple of mths, she has not done this.  Eating smaller portions.  Less rice.  Drinks mainly water.  Use to drink sodas. Very active taking care of her kids when home.  Also does a lot of walking at work.  Works in [employer] as a Programme researcher, broadcasting/film/video.  HTN: compliant with Norvasc and salt restriction.  No device to check blood pressure No CP/SOB/LE edema/PND  C/o rhinorrhea, sneezing and nasal congestion every yr when winter approaches.   Outpatient Encounter Medications as of 10/24/2019  Medication Sig  . amLODipine (NORVASC) 5 MG tablet Take 1 tablet (5 mg total) by mouth daily. MUST KEEP APPT ON 04/26/19 FOR FURTHER REFILLS  . ipratropium (ATROVENT) 0.03 % nasal spray Place 2 sprays into both nostrils 2 (two) times daily.   No facility-administered encounter medications on file as of 10/24/2019.       Observations/Objective: Results for orders placed or performed in visit on 06/21/19  CBC  Result Value Ref Range   WBC 8.1 3.4 - 10.8  x10E3/uL   RBC 4.22 3.77 - 5.28 x10E6/uL   Hemoglobin 12.8 11.1 - 15.9 g/dL   Hematocrit 37.8 34.0 - 46.6 %   MCV 90 79 - 97 fL   MCH 30.3 26.6 - 33.0 pg   MCHC 33.9 31.5 - 35.7 g/dL   RDW 13.1 11.7 - 15.4 %   Platelets 305 150 - 450 [id number]  Basic metabolic panel  Result Value Ref Range   Glucose 81 65 - 99 mg/dL   BUN 11 6 - 20 mg/dL   Creatinine, Ser 0.66 0.57 - 1.00 mg/dL   GFR calc non Af Amer 117 >59 mL/min/1.73   GFR calc Af Amer 135 >59 mL/min/1.73   BUN/Creatinine Ratio 17 9 - 23   Sodium 139 134 - 144 mmol/L   Potassium 4.2 3.5 - 5.2 mmol/L   Chloride 104 96 - 106 mmol/L   CO2 23 20 - 29 mmol/L   Calcium 9.2 8.7 - 10.2 mg/dL  Hemoglobin A1c  Result Value Ref Range   Hgb A1c MFr Bld 5.9 (H) 4.8 - 5.6 %   Est. average glucose Bld gHb Est-mCnc 123 mg/dL  Cytology - PAP  Result Value Ref Range   Adequacy      Satisfactory for evaluation  endocervical/transformation zone component PRESENT.   Diagnosis      NEGATIVE FOR INTRAEPITHELIAL LESIONS OR MALIGNANCY.   Chlamydia Negative    Neisseria Gonorrhea Negative    HPV NOT DETECTED    Trichomonas Negative  Material Submitted CervicoVaginal Pap [ThinPrep Imaged]      Assessment and Plan: 1. Essential hypertension Patient to continue amlodipine and low-salt diet. - amLODipine (NORVASC) 5 MG tablet; Take 1 tablet (5 mg total) by mouth daily.  Dispense: 30 tablet; Refill: 6  2. Prediabetes Discussed diagnosis with patient. Commended her on changes she has made so far in her eating habits.  Further dietary counseling given.  Encouraged her to stay active with goal of getting in at least 150 minutes/week of moderate intensity exercise  3. Non-seasonal allergic rhinitis, unspecified trigger - loratadine (CLARITIN) 10 MG tablet; Take 1 tablet (10 mg total) by mouth daily.  Dispense: 30 tablet; Refill: 3 - fluticasone (FLONASE) 50 MCG/ACT nasal spray; Place 1 spray into both nostrils daily as needed for allergies or  rhinitis.  Dispense: 16 g; Refill: 1   Follow Up Instructions: 4 mths   I discussed the assessment and treatment plan with the patient. The patient was provided an opportunity to ask questions and all were answered. The patient agreed with the plan and demonstrated an understanding of the instructions.   The patient was advised to call back or seek an in-person evaluation if the symptoms worsen or if the condition fails to improve as anticipated.  I provided 10 minutes of non-face-to-face time during this encounter.   Karle Plumber, MD

## 2019-10-29 MED FILL — ?AMLODIPINE BESYLATE 5MG TA: 5 | 30 days supply | Qty: 30 | Fill #2

## 2020-01-27 MED FILL — AMLODIPINE BESYLATE 5 MG TA: 5 | 30 days supply | Qty: 30 | Fill #3

## 2020-03-03 ENCOUNTER — Other Ambulatory Visit: Payer: Self-pay

## 2020-03-03 ENCOUNTER — Ambulatory Visit: Payer: Self-pay | Attending: Internal Medicine

## 2020-03-24 ENCOUNTER — Ambulatory Visit: Payer: Self-pay | Attending: Internal Medicine | Admitting: Internal Medicine

## 2020-03-24 ENCOUNTER — Other Ambulatory Visit: Payer: Self-pay

## 2020-03-24 DIAGNOSIS — R7303 Prediabetes: Secondary | ICD-10-CM

## 2020-03-24 DIAGNOSIS — I10 Essential (primary) hypertension: Secondary | ICD-10-CM

## 2020-03-24 MED ORDER — AMLODIPINE BESYLATE 5 MG PO TABS: 5.0000 mg | ORAL_TABLET | Freq: Every day | ORAL | 6 refills | Status: DC

## 2020-03-24 MED FILL — AMLODIPINE BESYLATE 5 MG TA: 5 | 30 days supply | Qty: 30 | Fill #0

## 2020-03-24 NOTE — Progress Notes (Signed)
Virtual Visit via Telephone Note   Due to current restrictions/limitations of in-office visits due to the COVID-19 pandemic, this scheduled clinical appointment was converted to a telehealth visit  I connected with Melissa Quinn on 03/24/20 at 3:57 p.m by telephone and verified that I am speaking with the correct person using two identifiers. I am in my office.  The patient is at home.  Only the patient and myself participated in this encounter.  I discussed the limitations, risks, security and privacy concerns of performing an evaluation and management service by telephone and the availability of in person appointments. I also discussed with the patient that there may be a patient responsible charge related to this service. The patient expressed understanding and agreed to proceed.   History of Present Illness: Patient with history of HTN, preDM.  Last seen 06/2019.  HYPERTENSION Currently taking: see medication list Med Adherence: [x]  Yes    []  No Medication side effects: []  Yes    [x]  No Adherence with salt restriction: [x]  Yes    []  No Home Monitoring?: []  Yes    [x]  No.  No device to check blood pressure Monitoring Frequency: []  Yes    []  No Home BP results range: []  Yes    []  No SOB? []  Yes    [x]  No Chest Pain?: []  Yes    [x]  No Leg swelling?: []  Yes    [x]  No Headaches?: []  Yes    [x]  No Dizziness? []  Yes    [x]  No Comments:   Pre-DM:  Out playing and walking with kids at park for 1 hr a day. Doing better with eating habits. Use to snack a lot at nights. She has stopped doing so.  Drinking more water Outpatient Encounter Medications as of 03/24/2020  Medication Sig  . amLODipine (NORVASC) 5 MG tablet Take 1 tablet (5 mg total) by mouth daily.  . fluticasone (FLONASE) 50 MCG/ACT nasal spray Place 1 spray into both nostrils daily as needed for allergies or rhinitis. (Patient not taking: Reported on 03/24/2020)  . loratadine (CLARITIN) 10 MG tablet Take 1 tablet (10 mg  total) by mouth daily. (Patient not taking: Reported on 03/24/2020)   No facility-administered encounter medications on file as of 03/24/2020.      Observations/Objective: Results for orders placed or performed in visit on 06/21/19  CBC  Result Value Ref Range   WBC 8.1 3.4 - 10.8 x10E3/uL   RBC 4.22 3.77 - 5.28 x10E6/uL   Hemoglobin 12.8 11.1 - 15.9 g/dL   Hematocrit 37.8 34.0 - 46.6 %   MCV 90 79 - 97 fL   MCH 30.3 26.6 - 33.0 pg   MCHC 33.9 31.5 - 35.7 g/dL   RDW 13.1 11.7 - 15.4 %   Platelets 305 150 - 450 [id number]  Basic metabolic panel  Result Value Ref Range   Glucose 81 65 - 99 mg/dL   BUN 11 6 - 20 mg/dL   Creatinine, Ser 0.66 0.57 - 1.00 mg/dL   GFR calc non Af Amer 117 >59 mL/min/1.73   GFR calc Af Amer 135 >59 mL/min/1.73   BUN/Creatinine Ratio 17 9 - 23   Sodium 139 134 - 144 mmol/L   Potassium 4.2 3.5 - 5.2 mmol/L   Chloride 104 96 - 106 mmol/L   CO2 23 20 - 29 mmol/L   Calcium 9.2 8.7 - 10.2 mg/dL  Hemoglobin A1c  Result Value Ref Range   Hgb A1c MFr Bld 5.9 (H) 4.8 -  5.6 %   Est. average glucose Bld gHb Est-mCnc 123 mg/dL  Cytology - PAP  Result Value Ref Range   Adequacy      Satisfactory for evaluation  endocervical/transformation zone component PRESENT.   Diagnosis      NEGATIVE FOR INTRAEPITHELIAL LESIONS OR MALIGNANCY.   Chlamydia Negative    Neisseria Gonorrhea Negative    HPV NOT DETECTED    Trichomonas Negative    Material Submitted CervicoVaginal Pap [ThinPrep Imaged]      Assessment and Plan: 1. Essential hypertension Continue Norvasc.  She will be given an appointment to see the clinical pharmacist next week for blood pressure check.  If blood pressure not at goal, please increase the amlodipine to 10 mg daily. - amLODipine (NORVASC) 5 MG tablet; Take 1 tablet (5 mg total) by mouth daily.  Dispense: 30 tablet; Refill: 6  2. Prediabetes Commended her on the changes she has made so far in her eating habits.  Encouraged her to continue  regular exercise.   Follow Up Instructions: 6 mths   I discussed the assessment and treatment plan with the patient. The patient was provided an opportunity to ask questions and all were answered. The patient agreed with the plan and demonstrated an understanding of the instructions.   The patient was advised to call back or seek an in-person evaluation if the symptoms worsen or if the condition fails to improve as anticipated.  I provided 6 minutes of non-face-to-face time during this encounter.   Karle Plumber, MD

## 2020-04-02 ENCOUNTER — Other Ambulatory Visit: Payer: Self-pay

## 2020-04-02 ENCOUNTER — Ambulatory Visit: Payer: Self-pay | Attending: Internal Medicine | Admitting: Pharmacist

## 2020-04-02 ENCOUNTER — Encounter: Payer: Self-pay | Admitting: Pharmacist

## 2020-04-02 ENCOUNTER — Other Ambulatory Visit: Payer: Self-pay | Admitting: Internal Medicine

## 2020-04-02 VITALS — BP 139/94 | HR 70

## 2020-04-02 DIAGNOSIS — I10 Essential (primary) hypertension: Secondary | ICD-10-CM

## 2020-04-02 MED ORDER — AMLODIPINE BESYLATE 10 MG PO TABS: 10.0000 mg | ORAL_TABLET | Freq: Every day | ORAL | 3 refills | Status: DC

## 2020-04-02 NOTE — Progress Notes (Signed)
   S:    Patient arrives in good spirits. Presents to the clinic for BP check. Patient was referred and last seen by Primary Care Provider on 03/24/2020.   Patient reports adherence with medications. Took this AM at 0700.   Patient denies chest pains or dyspnea. Denies HA, blurred vision. No LE edema.   Current BP Medications include:  Amlodipine 5 mg daily  Dietary habits include: compliant with salt restriction; denies drinking excess caffeine  Exercise habits include: mostly active at work; walks after work occasionally  Family / Social history:  - FHx: diabetes, HTN - Tobacco: never smoker  - Alcohol: denies use   O:  Vitals:   04/02/20 1103  BP: (!) 139/94  Pulse: 70     Home BP readings: no meter at home  Last 3 Office BP readings: BP Readings from Last 3 Encounters:  06/21/19 115/80  09/05/18 135/77  06/07/18 118/78    BMET    Component Value Date/Time   NA 139 06/21/2019 1441   K 4.2 06/21/2019 1441   CL 104 06/21/2019 1441   CO2 23 06/21/2019 1441   GLUCOSE 81 06/21/2019 1441   GLUCOSE 130 (H) 03/06/2018 0914   BUN 11 06/21/2019 1441   CREATININE 0.66 06/21/2019 1441   CREATININE 0.49 (L) 12/02/2016 0951   CALCIUM 9.2 06/21/2019 1441   GFRNONAA 117 06/21/2019 1441   GFRNONAA >89 12/02/2016 0951   GFRAA 135 06/21/2019 1441   GFRAA >89 12/02/2016 0951    Renal function: CrCl cannot be calculated (Patient's most recent lab result is older than the maximum 21 days allowed.).  Clinical ASCVD: No  The ASCVD Risk score Mikey Bussing DC Jr., et al., 2013) failed to calculate for the following reasons:   The 2013 ASCVD risk score is only valid for ages 21 to 2   A/P: Hypertension longstanding currently above goal on current medications. BP Goal = <130/80 mmHg. Patient endorses medication compliance and she has taken today's dose.  -Increased dose of amlodipine to 10 mg daily.  -Counseled on lifestyle modifications for blood pressure control including reduced  dietary sodium, increased exercise, adequate sleep  Results reviewed and written information provided.   Total time in face-to-face counseling 30 minutes.   F/U Clinic Visit in 1 month.   Benard Halsted, PharmD, [place] [phone]

## 2020-04-06 ENCOUNTER — Ambulatory Visit: Payer: Self-pay

## 2020-04-30 ENCOUNTER — Encounter: Payer: Self-pay | Admitting: Pharmacist

## 2020-04-30 ENCOUNTER — Other Ambulatory Visit: Payer: Self-pay

## 2020-04-30 ENCOUNTER — Ambulatory Visit: Payer: Self-pay | Attending: Internal Medicine | Admitting: Pharmacist

## 2020-04-30 VITALS — BP 131/84 | HR 76

## 2020-04-30 DIAGNOSIS — I10 Essential (primary) hypertension: Secondary | ICD-10-CM

## 2020-04-30 MED FILL — AMLODIPINE BESYLATE 10 MG T: 10 | 30 days supply | Qty: 30 | Fill #0

## 2020-04-30 NOTE — Progress Notes (Signed)
   S:    Patient arrives in good spirits. Presents to the clinic for BP check. Patient was referred and last seen by Primary Care Provider on 03/24/2020. I saw her on 04/02/2020 and increased amlodipine dose.   Patient reports adherence with medications. Took this AM at 10:00.   Patient denies chest pains or dyspnea. Denies HA, blurred vision. No LE edema.   Current BP Medications include:  Amlodipine 10 mg daily  Dietary habits include: compliant with salt restriction; denies drinking excess caffeine  Exercise habits include: mostly active at work; walks after work occasionally  Family / Social history:  - FHx: diabetes, HTN - Tobacco: never smoker  - Alcohol: denies use   O:  Vitals:   04/30/20 1116  BP: 131/84  Pulse: 76     Home BP readings: no meter at home  Last 3 Office BP readings: BP Readings from Last 3 Encounters:  04/30/20 131/84  04/02/20 (!) 139/94  06/21/19 115/80    BMET    Component Value Date/Time   NA 139 06/21/2019 1441   K 4.2 06/21/2019 1441   CL 104 06/21/2019 1441   CO2 23 06/21/2019 1441   GLUCOSE 81 06/21/2019 1441   GLUCOSE 130 (H) 03/06/2018 0914   BUN 11 06/21/2019 1441   CREATININE 0.66 06/21/2019 1441   CREATININE 0.49 (L) 12/02/2016 0951   CALCIUM 9.2 06/21/2019 1441   GFRNONAA 117 06/21/2019 1441   GFRNONAA >89 12/02/2016 0951   GFRAA 135 06/21/2019 1441   GFRAA >89 12/02/2016 0951    Renal function: CrCl cannot be calculated (Patient's most recent lab result is older than the maximum 21 days allowed.).  Clinical ASCVD: No  The ASCVD Risk score Mikey Bussing DC Jr., et al., 2013) failed to calculate for the following reasons:   The 2013 ASCVD risk score is only valid for ages 63 to 56   A/P: Hypertension longstanding currently above goal but improved on current medications. BP Goal = <130/80 mmHg. Patient endorses medication compliance and she has taken today's dose. Will make no changes; med adherence encouraged.  -Continued  current medications.  -Counseled on lifestyle modifications for blood pressure control including reduced dietary sodium, increased exercise, adequate sleep  Results reviewed and written information provided.   Total time in face-to-face counseling 30 minutes.   F/U Clinic Visit in 1 month.   Benard Halsted, PharmD, [place] [phone]

## 2020-06-04 ENCOUNTER — Ambulatory Visit: Payer: Self-pay | Admitting: Pharmacist

## 2020-06-18 ENCOUNTER — Ambulatory Visit: Payer: Self-pay | Admitting: Pharmacist

## 2020-06-25 ENCOUNTER — Encounter: Payer: Self-pay | Admitting: Pharmacist

## 2020-06-25 ENCOUNTER — Other Ambulatory Visit: Payer: Self-pay

## 2020-06-25 ENCOUNTER — Ambulatory Visit: Payer: Self-pay | Attending: Internal Medicine | Admitting: Pharmacist

## 2020-06-25 VITALS — BP 118/78

## 2020-06-25 DIAGNOSIS — I10 Essential (primary) hypertension: Secondary | ICD-10-CM

## 2020-06-25 MED FILL — AMLODIPINE BESYLATE 10 MG T: 10 | 30 days supply | Qty: 30 | Fill #1

## 2020-06-25 NOTE — Progress Notes (Signed)
   S:    Patient arrives in good spirits. Presents to the clinic for BP check. Patient was referred and last seen by Primary Care Provider on 03/24/2020. I have seen her several times since. BP has been trending down.   Patient reports adherence with medications. Took this AM.   Patient denies chest pains or dyspnea. Denies HA, blurred vision. No LE edema.   Current BP Medications include:  Amlodipine 10 mg daily  Dietary habits include: compliant with salt restriction; denies drinking excess caffeine  Exercise habits include: mostly active at work; walks after work occasionally  Family / Social history:  - FHx: diabetes, HTN - Tobacco: never smoker  - Alcohol: denies use   O:  Vitals:   06/25/20 1040  BP: 118/78    Home BP readings: no meter at home  Last 3 Office BP readings: BP Readings from Last 3 Encounters:  06/25/20 118/78  04/30/20 131/84  04/02/20 (!) 139/94   BMET    Component Value Date/Time   NA 139 06/21/2019 1441   K 4.2 06/21/2019 1441   CL 104 06/21/2019 1441   CO2 23 06/21/2019 1441   GLUCOSE 81 06/21/2019 1441   GLUCOSE 130 (H) 03/06/2018 0914   BUN 11 06/21/2019 1441   CREATININE 0.66 06/21/2019 1441   CREATININE 0.49 (L) 12/02/2016 0951   CALCIUM 9.2 06/21/2019 1441   GFRNONAA 117 06/21/2019 1441   GFRNONAA >89 12/02/2016 0951   GFRAA 135 06/21/2019 1441   GFRAA >89 12/02/2016 0951    Renal function: CrCl cannot be calculated (Patient's most recent lab result is older than the maximum 21 days allowed.).  Clinical ASCVD: No  The ASCVD Risk score Mikey Bussing DC Jr., et al., 2013) failed to calculate for the following reasons:   The 2013 ASCVD risk score is only valid for ages 29 to 42   A/P: Hypertension longstanding currently at goal on current medications. BP Goal = <130/80 mmHg. Patient endorses medication compliance and she has taken today's dose. Will make no changes; med adherence encouraged.  -Continued current medications.  -Counseled on  lifestyle modifications for blood pressure control including reduced dietary sodium, increased exercise, adequate sleep  Results reviewed and written information provided.   Total time in face-to-face counseling 30 minutes.   F/U with PCP.  Benard Halsted, PharmD, [place] [phone]

## 2020-08-12 MED FILL — AMLODIPINE BESYLATE 10 MG T: 10 | 30 days supply | Qty: 30 | Fill #2

## 2020-08-24 ENCOUNTER — Telehealth: Payer: Self-pay | Admitting: Internal Medicine

## 2020-08-24 NOTE — Telephone Encounter (Signed)
Pt was can an schedule a financial appt 09/04/20

## 2020-08-24 NOTE — Telephone Encounter (Signed)
Copied from [place] [id number]. Topic: General - Other >> Aug 24, 2020  1:43 PM Lennox Solders wrote: Reason for CRM:pt is calling to sch an appt with Melissa Quinn to apply for orange card

## 2020-09-04 ENCOUNTER — Ambulatory Visit: Payer: Self-pay | Attending: Internal Medicine

## 2020-09-04 ENCOUNTER — Other Ambulatory Visit: Payer: Self-pay

## 2020-09-24 ENCOUNTER — Ambulatory Visit: Payer: Self-pay | Admitting: Internal Medicine

## 2020-10-06 MED FILL — AMLODIPINE BESYLATE 10 MG T: 10 | 30 days supply | Qty: 30 | Fill #3

## 2020-11-19 ENCOUNTER — Ambulatory Visit: Payer: Self-pay | Admitting: Internal Medicine

## 2020-12-23 MED FILL — AMLODIPINE BESYLATE 10 MG T: 10 | 30 days supply | Qty: 30 | Fill #4

## 2021-01-08 ENCOUNTER — Other Ambulatory Visit: Payer: Self-pay | Admitting: Internal Medicine

## 2021-01-08 ENCOUNTER — Other Ambulatory Visit: Payer: Self-pay

## 2021-01-08 ENCOUNTER — Encounter: Payer: Self-pay | Admitting: Internal Medicine

## 2021-01-08 ENCOUNTER — Ambulatory Visit: Payer: Self-pay | Attending: Internal Medicine | Admitting: Internal Medicine

## 2021-01-08 VITALS — BP 122/81 | HR 82 | Temp 98.5°F | Resp 16 | Wt 161.2 lb

## 2021-01-08 DIAGNOSIS — R7303 Prediabetes: Secondary | ICD-10-CM

## 2021-01-08 DIAGNOSIS — Z23 Encounter for immunization: Secondary | ICD-10-CM

## 2021-01-08 DIAGNOSIS — I10 Essential (primary) hypertension: Secondary | ICD-10-CM

## 2021-01-08 MED ORDER — AMLODIPINE BESYLATE 10 MG PO TABS: 10.0000 mg | ORAL_TABLET | Freq: Every day | ORAL | 3 refills | Status: DC

## 2021-01-08 NOTE — Patient Instructions (Signed)
Influenza Virus Vaccine injection (Fluarix) What is this medicine? INFLUENZA VIRUS VACCINE (in floo EN zuh VAHY ruhs vak SEEN) helps to reduce the risk of getting influenza also known as the flu. This medicine may be used for other purposes; ask your health care provider or pharmacist if you have questions. COMMON BRAND NAME(S): Fluarix, Fluzone What should I tell my health care provider before I take this medicine? They need to know if you have any of these conditions:  bleeding disorder like hemophilia  fever or infection  Guillain-Barre syndrome or other neurological problems  immune system problems  infection with the human immunodeficiency virus (HIV) or AIDS  low blood platelet counts  multiple sclerosis  an unusual or allergic reaction to influenza virus vaccine, eggs, chicken proteins, latex, gentamicin, other medicines, foods, dyes or preservatives  pregnant or trying to get pregnant  breast-feeding How should I use this medicine? This vaccine is for injection into a muscle. It is given by a health care professional. A copy of Vaccine Information Statements will be given before each vaccination. Read this sheet carefully each time. The sheet may change frequently. Talk to your pediatrician regarding the use of this medicine in children. Special care may be needed. Overdosage: If you think you have taken too much of this medicine contact a poison control center or emergency room at once. NOTE: This medicine is only for you. Do not share this medicine with others. What if I miss a dose? This does not apply. What may interact with this medicine?  chemotherapy or radiation therapy  medicines that lower your immune system like etanercept, anakinra, infliximab, and adalimumab  medicines that treat or prevent blood clots like warfarin  phenytoin  steroid medicines like prednisone or cortisone  theophylline  vaccines This list may not describe all possible  interactions. Give your health care provider a list of all the medicines, herbs, non-prescription drugs, or dietary supplements you use. Also tell them if you smoke, drink alcohol, or use illegal drugs. Some items may interact with your medicine. What should I watch for while using this medicine? Report any side effects that do not go away within 3 days to your doctor or health care professional. Call your health care provider if any unusual symptoms occur within 6 weeks of receiving this vaccine. You may still catch the flu, but the illness is not usually as bad. You cannot get the flu from the vaccine. The vaccine will not protect against colds or other illnesses that may cause fever. The vaccine is needed every year. What side effects may I notice from receiving this medicine? Side effects that you should report to your doctor or health care professional as soon as possible:  allergic reactions like skin rash, itching or hives, swelling of the face, lips, or tongue Side effects that usually do not require medical attention (report to your doctor or health care professional if they continue or are bothersome):  fever  headache  muscle aches and pains  pain, tenderness, redness, or swelling at site where injected  weak or tired This list may not describe all possible side effects. Call your doctor for medical advice about side effects. You may report side effects to FDA at 1-800-FDA-1088. Where should I keep my medicine? This vaccine is only given in a clinic, pharmacy, doctor's office, or other health care setting and will not be stored at home. NOTE: This sheet is a summary. It may not cover all possible information. If you have questions   about this medicine, talk to your doctor, pharmacist, or health care provider.  2021 Elsevier/Gold Standard (2008-06-25 09:30:40)  

## 2021-01-08 NOTE — Progress Notes (Signed)
Virtual Visit via Telephone Note  I connected with Melissa Quinn on 01/08/21 at 5:01 p,mby telephone and verified that I am speaking with the correct person using two identifiers.  Location: Patient: home Provider: office The patient, my CMA Ms. Melissa Quinn participated in this encounter I discussed the limitations, risks, security and privacy concerns of performing an evaluation and management service by telephone and the availability of in person appointments. I also discussed with the patient that there may be a patient responsible charge related to this service. The patient expressed understanding and agreed to proceed.   History of Present Illness: Patient with history of HTN, preDM.  HTN: doing well on Norvasc 10 mg.  Limits salt in foods.  Try to get in exercise when she can.  She has 5 kids and they keep her busy Eating smaller portions.  Drinking more water instead of sodas and sweet tea. Outpatient Encounter Medications as of 01/08/2021  Medication Sig  . amLODipine (NORVASC) 10 MG tablet Take 1 tablet (10 mg total) by mouth daily.   No facility-administered encounter medications on file as of 01/08/2021.      Observations/Objective: Blood pressure 122/81, pulse 82, temperature 98.5 F (36.9 C), resp. rate 16, weight 161 lb 3.2 oz (73.1 kg), SpO2 96 %, currently breastfeeding.  Wt Readings from Last 3 Encounters:  01/08/21 161 lb 3.2 oz (73.1 kg)  06/21/19 156 lb (70.8 kg)  09/05/18 154 lb (69.9 kg)     Assessment and Plan: 1. Essential hypertension Close to goal.  Continue amlodipine and low-salt diet. - CBC; Future - Comprehensive metabolic panel; Future - Lipid panel; Future - Hemoglobin A1c; Future - amLODipine (NORVASC) 10 MG tablet; Take 1 tablet (10 mg total) by mouth daily.  Dispense: 90 tablet; Refill: 3  2. Prediabetes Commended her on dietary changes.  Other dietary recommendations given like cutting back on white carbohydrates and incorporating  fresh fruits and vegetables into the diet daily.  Encouraged her to move as much as she can  3. Need for immunization against influenza - Flu Vaccine QUAD 36+ mos IM   Follow Up Instructions: 4 months   I discussed the assessment and treatment plan with the patient. The patient was provided an opportunity to ask questions and all were answered. The patient agreed with the plan and demonstrated an understanding of the instructions.   The patient was advised to call back or seek an in-person evaluation if the symptoms worsen or if the condition fails to improve as anticipated.  I provided 6 minutes of non-face-to-face time during this encounter.   Karle Plumber, MD

## 2021-02-17 ENCOUNTER — Other Ambulatory Visit: Payer: Self-pay

## 2021-02-17 ENCOUNTER — Ambulatory Visit: Payer: Self-pay | Attending: Internal Medicine

## 2021-02-17 DIAGNOSIS — I10 Essential (primary) hypertension: Secondary | ICD-10-CM

## 2021-02-17 MED FILL — AMLODIPINE BESYLATE 10 MG T: 10 | 30 days supply | Qty: 30 | Fill #5

## 2021-02-18 LAB — COMPREHENSIVE METABOLIC PANEL
ALT: 25 IU/L (ref 0–32)
AST: 16 IU/L (ref 0–40)
Albumin/Globulin Ratio: 1.6 (ref 1.2–2.2)
Albumin: 4.6 g/dL (ref 3.8–4.8)
Alkaline Phosphatase: 84 IU/L (ref 44–121)
BUN/Creatinine Ratio: 23 (ref 9–23)
BUN: 13 mg/dL (ref 6–20)
Bilirubin Total: 0.3 mg/dL (ref 0.0–1.2)
CO2: 22 mmol/L (ref 20–29)
Calcium: 9.2 mg/dL (ref 8.7–10.2)
Chloride: 104 mmol/L (ref 96–106)
Creatinine, Ser: 0.57 mg/dL (ref 0.57–1.00)
Globulin, Total: 2.9 g/dL (ref 1.5–4.5)
Glucose: 91 mg/dL (ref 65–99)
Potassium: 4.6 mmol/L (ref 3.5–5.2)
Sodium: 141 mmol/L (ref 134–144)
Total Protein: 7.5 g/dL (ref 6.0–8.5)
eGFR: 122 mL/min/{1.73_m2} (ref >59)

## 2021-02-18 LAB — CBC
Hematocrit: 39.4 % (ref 34.0–46.6)
Hemoglobin: 13.2 g/dL (ref 11.1–15.9)
MCH: 30.7 pg (ref 26.6–33.0)
MCHC: 33.5 g/dL (ref 31.5–35.7)
MCV: 92 fL (ref 79–97)
Platelets: 335 10*3/uL (ref 150–450)
RBC: 4.3 x10E6/uL (ref 3.77–5.28)
RDW: 13.1 % (ref 11.7–15.4)
WBC: 8.5 10*3/uL (ref 3.4–10.8)

## 2021-02-18 LAB — LIPID PANEL
Chol/HDL Ratio: 3.4 ratio (ref 0.0–4.4)
Cholesterol, Total: 200 mg/dL — ABNORMAL HIGH (ref 100–199)
HDL: 59 mg/dL (ref >39)
LDL Chol Calc (NIH): 109 mg/dL — ABNORMAL HIGH (ref 0–99)
Triglycerides: 186 mg/dL — ABNORMAL HIGH (ref 0–149)
VLDL Cholesterol Cal: 32 mg/dL (ref 5–40)

## 2021-02-18 LAB — HEMOGLOBIN A1C
Est. average glucose Bld gHb Est-mCnc: 126 mg/dL
Hgb A1c MFr Bld: 6 % — ABNORMAL HIGH (ref 4.8–5.6)

## 2021-03-13 ENCOUNTER — Other Ambulatory Visit: Payer: Self-pay

## 2021-03-16 ENCOUNTER — Ambulatory Visit: Payer: Self-pay

## 2021-03-19 ENCOUNTER — Other Ambulatory Visit: Payer: Self-pay

## 2021-03-19 ENCOUNTER — Ambulatory Visit: Payer: Self-pay | Attending: Internal Medicine

## 2021-04-09 ENCOUNTER — Other Ambulatory Visit: Payer: Self-pay

## 2021-04-09 ENCOUNTER — Ambulatory Visit: Payer: Self-pay | Attending: Internal Medicine

## 2021-05-07 ENCOUNTER — Other Ambulatory Visit: Payer: Self-pay

## 2021-05-07 ENCOUNTER — Ambulatory Visit: Payer: Self-pay | Attending: Internal Medicine | Admitting: Internal Medicine

## 2021-05-07 ENCOUNTER — Encounter: Payer: Self-pay | Admitting: Internal Medicine

## 2021-05-07 VITALS — BP 130/78 | HR 85 | Resp 16 | Wt 163.8 lb

## 2021-05-07 DIAGNOSIS — I10 Essential (primary) hypertension: Secondary | ICD-10-CM

## 2021-05-07 DIAGNOSIS — R7303 Prediabetes: Secondary | ICD-10-CM

## 2021-05-07 DIAGNOSIS — H1013 Acute atopic conjunctivitis, bilateral: Secondary | ICD-10-CM

## 2021-05-07 DIAGNOSIS — E782 Mixed hyperlipidemia: Secondary | ICD-10-CM

## 2021-05-07 DIAGNOSIS — E663 Overweight: Secondary | ICD-10-CM

## 2021-05-07 MED ORDER — AMLODIPINE BESYLATE 10 MG PO TABS
ORAL_TABLET | Freq: Every day | ORAL | 3 refills | Status: DC
  Filled 2021-05-07 – 2021-05-26 (×2): qty 30, 30d supply, fill #0
  Filled 2021-07-13: qty 30, 30d supply, fill #1
  Filled 2021-09-07: qty 30, 30d supply, fill #2
  Filled 2021-10-07: qty 30, 30d supply, fill #3
  Filled 2021-11-10: qty 30, 30d supply, fill #4
  Filled 2021-12-09: qty 30, 30d supply, fill #5
  Filled 2022-01-07: qty 30, 30d supply, fill #0
  Filled 2022-02-16: qty 30, 30d supply, fill #1
  Filled 2022-03-24: qty 90, 90d supply, fill #2

## 2021-05-07 NOTE — Patient Instructions (Signed)
You can purchase and use the allergy eyedrop: Naphcon-A as needed.  This can be purchased over-the-counter.   Prediabetes Eating Plan Prediabetes is a condition that causes blood sugar (glucose) levels to be higher than normal. This increases the risk for developing type 2 diabetes (type 2 diabetes mellitus). Working with a health care provider or nutrition specialist (dietitian) to make diet and lifestyle changes can help prevent the onset of diabetes. These changes may help you:  Control your blood glucose levels.  Improve your cholesterol levels.  Manage your blood pressure. What are tips for following this plan? Reading food labels  Read food labels to check the amount of fat, salt (sodium), and sugar in prepackaged foods. Avoid foods that have: ? Saturated fats. ? Trans fats. ? Added sugars.  Avoid foods that have more than 300 milligrams (mg) of sodium per serving. Limit your sodium intake to less than 2,300 mg each day. Shopping  Avoid buying pre-made and processed foods.  Avoid buying drinks with added sugar. Cooking  Cook with olive oil. Do not use butter, lard, or ghee.  Bake, broil, grill, steam, or boil foods. Avoid frying. Meal planning  Work with your dietitian to create an eating plan that is right for you. This may include tracking how many calories you take in each day. Use a food diary, notebook, or mobile application to track what you eat at each meal.  Consider following a Mediterranean diet. This includes: ? Eating several servings of fresh fruits and vegetables each day. ? Eating fish at least twice a week. ? Eating one serving each day of whole grains, beans, nuts, and seeds. ? Using olive oil instead of other fats. ? Limiting alcohol. ? Limiting red meat. ? Using nonfat or low-fat dairy products.  Consider following a plant-based diet. This includes dietary choices that focus on eating mostly vegetables and fruit, grains, beans, nuts, and  seeds.  If you have high blood pressure, you may need to limit your sodium intake or follow a diet such as the DASH (Dietary Approaches to Stop Hypertension) eating plan. The DASH diet aims to lower high blood pressure.   Lifestyle  Set weight loss goals with help from your health care team. It is recommended that most people with prediabetes lose 7% of their body weight.  Exercise for at least 30 minutes 5 or more days a week.  Attend a support group or seek support from a mental health counselor.  Take over-the-counter and prescription medicines only as told by your health care provider. What foods are recommended? Fruits Berries. Bananas. Apples. Oranges. Grapes. Papaya. Mango. Pomegranate. Kiwi. Grapefruit. Cherries. Vegetables Lettuce. Spinach. Peas. Beets. Cauliflower. Cabbage. Broccoli. Carrots. Tomatoes. Squash. Eggplant. Herbs. Peppers. Onions. Cucumbers. Brussels sprouts. Grains Whole grains, such as whole-wheat or whole-grain breads, crackers, cereals, and pasta. Unsweetened oatmeal. Bulgur. Barley. Quinoa. Brown rice. Corn or whole-wheat flour tortillas or taco shells. Meats and other proteins Seafood. Poultry without skin. Lean cuts of pork and beef. Tofu. Eggs. Nuts. Beans. Dairy Low-fat or fat-free dairy products, such as yogurt, cottage cheese, and cheese. Beverages Water. Tea. Coffee. Sugar-free or diet soda. Seltzer water. Low-fat or nonfat milk. Milk alternatives, such as soy or almond milk. Fats and oils Olive oil. Canola oil. Sunflower oil. Grapeseed oil. Avocado. Walnuts. Sweets and desserts Sugar-free or low-fat pudding. Sugar-free or low-fat ice cream and other frozen treats. Seasonings and condiments Herbs. Sodium-free spices. Mustard. Relish. Low-salt, low-sugar ketchup. Low-salt, low-sugar barbecue sauce. Low-fat or fat-free mayonnaise. The  items listed above may not be a complete list of recommended foods and beverages. Contact a dietitian for more  information. What foods are not recommended? Fruits Fruits canned with syrup. Vegetables Canned vegetables. Frozen vegetables with butter or cream sauce. Grains Refined white flour and flour products, such as bread, pasta, snack foods, and cereals. Meats and other proteins Fatty cuts of meat. Poultry with skin. Breaded or fried meat. Processed meats. Dairy Full-fat yogurt, cheese, or milk. Beverages Sweetened drinks, such as iced tea and soda. Fats and oils Butter. Lard. Ghee. Sweets and desserts Baked goods, such as cake, cupcakes, pastries, cookies, and cheesecake. Seasonings and condiments Spice mixes with added salt. Ketchup. Barbecue sauce. Mayonnaise. The items listed above may not be a complete list of foods and beverages that are not recommended. Contact a dietitian for more information. Where to find more information  American Diabetes Association: www.diabetes.org Summary  You may need to make diet and lifestyle changes to help prevent the onset of diabetes. These changes can help you control blood sugar, improve cholesterol levels, and manage blood pressure.  Set weight loss goals with help from your health care team. It is recommended that most people with prediabetes lose 7% of their body weight.  Consider following a Mediterranean diet. This includes eating plenty of fresh fruits and vegetables, whole grains, beans, nuts, seeds, fish, and low-fat dairy, and using olive oil instead of other fats. This information is not intended to replace advice given to you by your health care provider. Make sure you discuss any questions you have with your health care provider. Document Revised: 02/27/2020 Document Reviewed: 02/27/2020 Elsevier Patient Education  [place].

## 2021-05-07 NOTE — Progress Notes (Signed)
Patient ID: Melissa Quinn, female    DOB: 1986/04/15  MRN: [id number]  CC: Hypertension   Subjective: Melissa Quinn is a 35 y.o. female who presents for chronic ds management Her concerns today include: Patient with history of HTN, preDM.  HYPERTENSION Currently taking: see medication list.  On Norvasc Med Adherence: [x]  Yes    []  No Medication side effects: []  Yes    [x]  No Adherence with salt restriction: [x]  Yes    []  No Home Monitoring?: []  Yes    [x]  No Monitoring Frequency: []  Yes    []  No Home BP results range: []  Yes    []  No SOB? []  Yes    [x]  No Chest Pain?: []  Yes    [x]  No Leg swelling?: []  Yes    [x]  No Headaches?: []  Yes    [x]  No Dizziness? []  Yes    [x]  No Comments: went back to work in March at [employer]  Over wgh/PreDM:  Cut back on tortia.  She has not drink sodas in past 2 mths.  Cut out other sweet drinks. Goes to park 3 x a wk with her children to walk.  She is disappointed that she has not lost weight.  She feels that she has lost some inches around her waist as her clothes are fitting a little bit more loose. A1C 02/2021 was 6. LDL chol 109.   Complains of lacrimation and burning in the eyes sometimes when she steps outside.  Denies any sneezing, itchy eyes, itchy throat. Patient Active Problem List   Diagnosis Date Noted  . Prediabetes 10/24/2019  . Non-seasonal allergic rhinitis 10/24/2019  . Essential hypertension 06/27/2017  . Benign mole 06/27/2017  . History of severe pre-eclampsia 09/22/2016  . Chronic hypertension with superimposed preeclampsia 06/28/2016     No current outpatient medications on file prior to visit.   No current facility-administered medications on file prior to visit.    Allergies  Allergen Reactions  . Penicillins Hives    Has patient had a PCN reaction causing immediate rash, facial/tongue/throat swelling, SOB or lightheadedness with hypotension: Yes Has patient had a PCN reaction causing severe rash  involving mucus membranes or skin necrosis: No Has patient had a PCN reaction that required hospitalization Yes Has patient had a PCN reaction occurring within the last 10 years: No If all of the above answers are "NO", then may proceed with Cephalosporin use.     Social History   Socioeconomic History  . Marital status: Married    Spouse name: Not on file  . Number of children: Not on file  . Years of education: Not on file  . Highest education level: Not on file  Occupational History  . Not on file  Tobacco Use  . Smoking status: Never Smoker  . Smokeless tobacco: Never Used  Substance and Sexual Activity  . Alcohol use: No  . Drug use: No  . Sexual activity: Yes    Birth control/protection: None  Other Topics Concern  . Not on file  Social History Narrative  . Not on file   Social Determinants of Health   Financial Resource Strain: Not on file  Food Insecurity: Not on file  Transportation Needs: Not on file  Physical Activity: Not on file  Stress: Not on file  Social Connections: Not on file  Intimate Partner Violence: Not on file    Family History  Problem Relation Age of Onset  . Diabetes Mother   .  Hypertension Mother   . Hypertension Sister   . Asthma Son   . Diabetes Maternal Grandmother     Past Surgical History:  Procedure Laterality Date  . CESAREAN SECTION N/A 10/23/2013   Procedure: Primary Cesarean Section Delivery Baby Girl @ 0004, Apgars 8/8;  Surgeon: Jonnie Kind, MD;  Location: [facility];  Service: Obstetrics;  Laterality: N/A;  . TUBAL LIGATION Bilateral 01/02/2017   Procedure: POST PARTUM TUBAL LIGATION;  Surgeon: Woodroe Mode, MD;  Location: [facility];  Service: Gynecology;  Laterality: Bilateral;    ROS: Review of Systems Negative except as stated above  PHYSICAL EXAM: BP 130/78   Pulse 85   Resp 16   Wt 163 lb 12.8 oz (74.3 kg)   SpO2 96%   BMI 29.96 kg/m   Wt Readings from Last 3 Encounters:  05/07/21 163 lb 12.8 oz (74.3  kg)  01/08/21 161 lb 3.2 oz (73.1 kg)  06/21/19 156 lb (70.8 kg)    Physical Exam  General appearance - alert, well appearing, and in no distress Mental status - normal mood, behavior, speech, dress, motor activity, and thought processes Eyes - pupils equal and reactive, extraocular eye movements intact.  No conjunctival injection. Neck - supple, no significant adenopathy Chest - clear to auscultation, no wheezes, rales or rhonchi, symmetric air entry Heart - normal rate, regular rhythm, normal S1, S2, no murmurs, rubs, clicks or gallops Extremities - peripheral pulses normal, no pedal edema, no clubbing or cyanosis     CMP Latest Ref Rng & Units 02/17/2021 06/21/2019 03/06/2018  Glucose 65 - 99 mg/dL 91 81 130(H)  BUN 6 - 20 mg/dL 13 11 7   Creatinine 0.57 - 1.00 mg/dL 0.57 0.66 0.64  Sodium 134 - 144 mmol/L 141 139 140  Potassium 3.5 - 5.2 mmol/L 4.6 4.2 3.4(L)  Chloride 96 - 106 mmol/L 104 104 104  CO2 20 - 29 mmol/L 22 23 26   Calcium 8.7 - 10.2 mg/dL 9.2 9.2 9.1  Total Protein 6.0 - 8.5 g/dL 7.5 - -  Total Bilirubin 0.0 - 1.2 mg/dL 0.3 - -  Alkaline Phos 44 - 121 IU/L 84 - -  AST 0 - 40 IU/L 16 - -  ALT 0 - 32 IU/L 25 - -   Lipid Panel     Component Value Date/Time   CHOL 200 (H) 02/17/2021 0857   TRIG 186 (H) 02/17/2021 0857   HDL 59 02/17/2021 0857   CHOLHDL 3.4 02/17/2021 0857   LDLCALC 109 (H) 02/17/2021 0857    CBC    Component Value Date/Time   WBC 8.5 02/17/2021 0857   WBC 8.6 03/06/2018 0914   RBC 4.30 02/17/2021 0857   RBC 4.62 03/06/2018 0914   HGB 13.2 02/17/2021 0857   HCT 39.4 02/17/2021 0857   PLT 335 02/17/2021 0857   MCV 92 02/17/2021 0857   MCH 30.7 02/17/2021 0857   MCH 30.5 03/06/2018 0914   MCHC 33.5 02/17/2021 0857   MCHC 33.7 03/06/2018 0914   RDW 13.1 02/17/2021 0857   LYMPHSABS 2,850 06/28/2016 1005   MONOABS 570 06/28/2016 1005   EOSABS 190 06/28/2016 1005   BASOSABS 0 06/28/2016 1005    ASSESSMENT AND PLAN:  1. Essential  hypertension At goal.  Continue amlodipine and low-salt diet - amLODipine (NORVASC) 10 MG tablet; TAKE 1 TABLET (10 MG TOTAL) BY MOUTH DAILY.  Dispense: 90 tablet; Refill: 3  2. Prediabetes 3. Over weight Commended her on changing her eating habits.  Further  dietary counseling given.  Encourage to try to get in about 150 minutes/week total of moderate intensity exercise.  4. Mixed hyperlipidemia See #3 above.  5. Allergic conjunctivitis of both eyes Recommend purchasing Naphcon-A eyedrops over-the-counter and using as needed.   Patient was given the opportunity to ask questions.  Patient verbalized understanding of the plan and was able to repeat key elements of the plan.   No orders of the defined types were placed in this encounter.    Requested Prescriptions   Signed Prescriptions Disp Refills  . amLODipine (NORVASC) 10 MG tablet 90 tablet 3    Sig: TAKE 1 TABLET (10 MG TOTAL) BY MOUTH DAILY.    Return in about 5 months (around 10/07/2021).  Karle Plumber, MD, FACP

## 2021-05-11 ENCOUNTER — Ambulatory Visit: Payer: Self-pay | Admitting: Internal Medicine

## 2021-05-14 ENCOUNTER — Other Ambulatory Visit: Payer: Self-pay

## 2021-05-26 ENCOUNTER — Other Ambulatory Visit: Payer: Self-pay

## 2021-05-27 ENCOUNTER — Other Ambulatory Visit: Payer: Self-pay

## 2021-07-14 ENCOUNTER — Other Ambulatory Visit: Payer: Self-pay

## 2021-07-16 ENCOUNTER — Other Ambulatory Visit: Payer: Self-pay

## 2021-08-25 ENCOUNTER — Telehealth: Payer: Self-pay | Admitting: Internal Medicine

## 2021-08-25 NOTE — Telephone Encounter (Signed)
Copied from [place] [id number]. Topic: General - Other >> Aug 23, 2021  3:07 PM Leward Quan A wrote: Reason for CRM: Patient called in to inform Dr Wynetta Emery that she have a discharge and its white started 2 weeks ago. Started before menstrual cycle. No other issues in the vaginal area but concerned please call patient  Ph# [phone]

## 2021-08-25 NOTE — Telephone Encounter (Signed)
Will forward to provider. Pt is requesting antibotic

## 2021-08-26 NOTE — Telephone Encounter (Signed)
Erika please call pt and schedule

## 2021-09-07 ENCOUNTER — Other Ambulatory Visit: Payer: Self-pay

## 2021-09-08 ENCOUNTER — Other Ambulatory Visit: Payer: Self-pay

## 2021-09-17 NOTE — Telephone Encounter (Signed)
Called pt and offered another but due to work schedule pt stated she'd keep OG appt.

## 2021-10-07 ENCOUNTER — Encounter: Payer: Self-pay | Admitting: Internal Medicine

## 2021-10-07 ENCOUNTER — Other Ambulatory Visit: Payer: Self-pay

## 2021-10-07 ENCOUNTER — Ambulatory Visit: Payer: Self-pay | Attending: Internal Medicine | Admitting: Internal Medicine

## 2021-10-07 VITALS — BP 138/84 | HR 91 | Resp 16 | Wt 166.2 lb

## 2021-10-07 DIAGNOSIS — I10 Essential (primary) hypertension: Secondary | ICD-10-CM

## 2021-10-07 DIAGNOSIS — N640 Fissure and fistula of nipple: Secondary | ICD-10-CM

## 2021-10-07 DIAGNOSIS — E669 Obesity, unspecified: Secondary | ICD-10-CM

## 2021-10-07 DIAGNOSIS — Z23 Encounter for immunization: Secondary | ICD-10-CM

## 2021-10-07 NOTE — Patient Instructions (Signed)

## 2021-10-07 NOTE — Progress Notes (Signed)
Patient ID: Melissa Quinn, female    DOB: 10/08/1986  MRN: [id number]  CC: Hypertension   Subjective: Melissa Quinn is a 35 y.o. female who presents for chronic ds management Her concerns today include:  Patient with history of HTN, preDM, HL.   HYPERTENSION Currently taking: see medication list.  On Norvasc Med Adherence:  Forgot to take a few days this wk.  Leaves for work earlier in the mornings and she forgets to take it on her way out the door some mornings Medication side effects: []  Yes    [x]  No Adherence with salt restriction: [x]  Yes    []  No Home Monitoring?: []  Yes    [x]  No Monitoring Frequency:  Home BP results range:  SOB? []  Yes    [x]  No Chest Pain?: []  Yes    [x]  No Leg swelling?: []  Yes    [x]  No Headaches?: []  Yes    [x]  No Dizziness? []  Yes    []  No Comments:   PreDM/obesity: feels like she loss inches.  Down a pant size. Walking almost daily but some days work does not permit.  Doing better with eating habits.  She cut out drinking sodas.  Drinks 1/2 and 1/2 sweet tea about once a mth  Concerned about a line on the right nipple that she noticed about a month ago.  She does not feel any mass in the breast around the nipple.  The area does not hurt.  Her mother has breast cancer..  Patient Active Problem List   Diagnosis Date Noted   Prediabetes 10/24/2019   Non-seasonal allergic rhinitis 10/24/2019   Essential hypertension 06/27/2017   Benign mole 06/27/2017   History of severe pre-eclampsia 09/22/2016   Chronic hypertension with superimposed preeclampsia 06/28/2016     Current Outpatient Medications on File Prior to Visit  Medication Sig Dispense Refill   amLODipine (NORVASC) 10 MG tablet TAKE 1 TABLET (10 MG TOTAL) BY MOUTH DAILY. 90 tablet 3   No current facility-administered medications on file prior to visit.    Allergies  Allergen Reactions   Penicillins Hives    Has patient had a PCN reaction causing immediate rash,  facial/tongue/throat swelling, SOB or lightheadedness with hypotension: Yes Has patient had a PCN reaction causing severe rash involving mucus membranes or skin necrosis: No Has patient had a PCN reaction that required hospitalization Yes Has patient had a PCN reaction occurring within the last 10 years: No If all of the above answers are "NO", then may proceed with Cephalosporin use.     Social History   Socioeconomic History   Marital status: Married    Spouse name: Not on file   Number of children: Not on file   Years of education: Not on file   Highest education level: Not on file  Occupational History   Not on file  Tobacco Use   Smoking status: Never   Smokeless tobacco: Never  Substance and Sexual Activity   Alcohol use: No   Drug use: No   Sexual activity: Yes    Birth control/protection: None  Other Topics Concern   Not on file  Social History Narrative   Not on file   Social Determinants of Health   Financial Resource Strain: Not on file  Food Insecurity: Not on file  Transportation Needs: Not on file  Physical Activity: Not on file  Stress: Not on file  Social Connections: Not on file  Intimate Partner Violence: Not on file  Family History  Problem Relation Age of Onset   Diabetes Mother    Hypertension Mother    Hypertension Sister    Asthma Son    Diabetes Maternal Grandmother     Past Surgical History:  Procedure Laterality Date   CESAREAN SECTION N/A 10/23/2013   Procedure: Primary Cesarean Section Delivery Baby Girl @ 0004, Apgars 8/8;  Surgeon: Jonnie Kind, MD;  Location: [facility];  Service: Obstetrics;  Laterality: N/A;   TUBAL LIGATION Bilateral 01/02/2017   Procedure: POST PARTUM TUBAL LIGATION;  Surgeon: Woodroe Mode, MD;  Location: [facility];  Service: Gynecology;  Laterality: Bilateral;    ROS: Review of Systems Negative except as stated above  PHYSICAL EXAM: BP 138/84   Pulse 91   Resp 16   Wt 166 lb 3.2 oz (75.4 kg)   SpO2  98%   BMI 30.40 kg/m   Wt Readings from Last 3 Encounters:  10/07/21 166 lb 3.2 oz (75.4 kg)  05/07/21 163 lb 12.8 oz (74.3 kg)  01/08/21 161 lb 3.2 oz (73.1 kg)    Physical Exam  General appearance - alert, well appearing, and in no distress Mental status - normal mood, behavior, speech, dress, motor activity, and thought processes Neck - supple, no significant adenopathy Chest - clear to auscultation, no wheezes, rales or rhonchi, symmetric air entry Heart - normal rate, regular rhythm, normal S1, S2, no murmurs, rubs, clicks or gallops Breasts -CMA [employer] present: No axillary lymphadenopathy.  Breasts are without palpable masses.  She does have horizontal crack in both nipples but more pronounced on the right.  No discharge expressed from either nipple Extremities - peripheral pulses normal, no pedal edema, no clubbing or cyanosis   CMP Latest Ref Rng & Units 02/17/2021 06/21/2019 03/06/2018  Glucose 65 - 99 mg/dL 91 81 130(H)  BUN 6 - 20 mg/dL 13 11 7   Creatinine 0.57 - 1.00 mg/dL 0.57 0.66 0.64  Sodium 134 - 144 mmol/L 141 139 140  Potassium 3.5 - 5.2 mmol/L 4.6 4.2 3.4(L)  Chloride 96 - 106 mmol/L 104 104 104  CO2 20 - 29 mmol/L 22 23 26   Calcium 8.7 - 10.2 mg/dL 9.2 9.2 9.1  Total Protein 6.0 - 8.5 g/dL 7.5 - -  Total Bilirubin 0.0 - 1.2 mg/dL 0.3 - -  Alkaline Phos 44 - 121 IU/L 84 - -  AST 0 - 40 IU/L 16 - -  ALT 0 - 32 IU/L 25 - -   Lipid Panel     Component Value Date/Time   CHOL 200 (H) 02/17/2021 0857   TRIG 186 (H) 02/17/2021 0857   HDL 59 02/17/2021 0857   CHOLHDL 3.4 02/17/2021 0857   LDLCALC 109 (H) 02/17/2021 0857    CBC    Component Value Date/Time   WBC 8.5 02/17/2021 0857   WBC 8.6 03/06/2018 0914   RBC 4.30 02/17/2021 0857   RBC 4.62 03/06/2018 0914   HGB 13.2 02/17/2021 0857   HCT 39.4 02/17/2021 0857   PLT 335 02/17/2021 0857   MCV 92 02/17/2021 0857   MCH 30.7 02/17/2021 0857   MCH 30.5 03/06/2018 0914   MCHC 33.5 02/17/2021 0857    MCHC 33.7 03/06/2018 0914   RDW 13.1 02/17/2021 0857   LYMPHSABS 2,850 06/28/2016 1005   MONOABS 570 06/28/2016 1005   EOSABS 190 06/28/2016 1005   BASOSABS 0 06/28/2016 1005    ASSESSMENT AND PLAN: 1. Essential hypertension Not at goal.  We discussed adding another  medication versus her following up with the clinical pharmacist in 3 weeks for recheck.  Patient opted for the latter.  I recommend purchasing a medication box and filling it every week with the Norvasc and keeping it in a visible place where she can see it before she leaves the house in the mornings for work.  2. Obesity (BMI 30.0-34.9) Discussed and encourage healthy eating habits.  Commended her for giving up sodas.  Encouraged her to get rid of other sugary drinks including sweet tea.  Printed information given on healthy eating habits.  3. Nipple fissure Given form for mammogram scholarship to allow her to get a mammogram. - MM Digital Diagnostic Unilat R; Future  4. Need for immunization against influenza - Flu Vaccine QUAD 34mo+IM (Fluarix, Fluzone & Alfiuria Quad PF)    Patient was given the opportunity to ask questions.  Patient verbalized understanding of the plan and was able to repeat key elements of the plan.   Orders Placed This Encounter  Procedures   MM Digital Diagnostic Unilat R   Flu Vaccine QUAD 80mo+IM (Fluarix, Fluzone & Alfiuria Quad PF)     Requested Prescriptions    No prescriptions requested or ordered in this encounter    Return in about 4 months (around 02/07/2022).  Karle Plumber, MD, FACP

## 2021-10-14 ENCOUNTER — Other Ambulatory Visit: Payer: Self-pay | Admitting: Internal Medicine

## 2021-10-14 ENCOUNTER — Other Ambulatory Visit: Payer: Self-pay

## 2021-10-14 DIAGNOSIS — N6452 Nipple discharge: Secondary | ICD-10-CM

## 2021-10-14 DIAGNOSIS — N640 Fissure and fistula of nipple: Secondary | ICD-10-CM

## 2021-10-14 DIAGNOSIS — N6459 Other signs and symptoms in breast: Secondary | ICD-10-CM

## 2021-10-18 ENCOUNTER — Ambulatory Visit: Payer: Self-pay

## 2021-11-10 ENCOUNTER — Other Ambulatory Visit: Payer: Self-pay

## 2021-11-11 ENCOUNTER — Other Ambulatory Visit: Payer: Self-pay

## 2021-11-16 ENCOUNTER — Ambulatory Visit
Admission: RE | Admit: 2021-11-16 | Discharge: 2021-11-16 | Disposition: A | Discharge status: Home / Self Care | Payer: No Typology Code available for payment source | Source: Ambulatory Visit | Attending: Obstetrics and Gynecology | Admitting: Obstetrics and Gynecology

## 2021-11-16 ENCOUNTER — Ambulatory Visit: Payer: Self-pay | Admitting: *Deleted

## 2021-11-16 ENCOUNTER — Other Ambulatory Visit: Payer: Self-pay

## 2021-11-16 ENCOUNTER — Other Ambulatory Visit (HOSPITAL_COMMUNITY)
Admission: RE | Admit: 2021-11-16 | Discharge: 2021-11-16 | Disposition: A | Discharge status: Home / Self Care | Payer: No Typology Code available for payment source | Source: Ambulatory Visit | Attending: Obstetrics and Gynecology | Admitting: Obstetrics and Gynecology

## 2021-11-16 ENCOUNTER — Ambulatory Visit: Payer: Self-pay

## 2021-11-16 VITALS — BP 146/88 | Wt 162.1 lb

## 2021-11-16 DIAGNOSIS — N6452 Nipple discharge: Secondary | ICD-10-CM

## 2021-11-16 DIAGNOSIS — Z1239 Encounter for other screening for malignant neoplasm of breast: Secondary | ICD-10-CM

## 2021-11-16 NOTE — Addendum Note (Signed)
Addended by: Demetrius Revel on: 11/16/2021 02:05 PM   Modules accepted: Orders

## 2021-11-16 NOTE — Progress Notes (Signed)
Ms. Melissa Quinn is a 35 y.o. female who presents to [facility] clinic today with complaint of lump on right nipple that she first noticed in September 2022 that resolved one month ago. Patient stated it was painful and the pain resolved one month ago. Patient rated the pain at a 5 out of 10 before it resolved. Patient complained of bilateral nipple discharge x one year that is milky color and spontaneous..    Pap Smear: Pap smear not completed today. Last Pap smear was 06/21/2019 at [facility] and was normal with negative HPV. Per patient has history of an abnormal Pap smear around 2011 or 2012 that a colposcopy was completed for follow-up. Patient stated all Pap smears have been normal since colposcopy and has had at least three normal Pap smears. Last Pap smear result is available in Epic.   Physical exam: Breasts Breasts symmetrical. No skin abnormalities bilateral breasts. Bilateral nipples slightly inverted. Expressed a thick white discharge from right breast on exam. Expressed a yellowish colored discharge from left breast on exam. Sample of right and left breast discharge sent to Cytology for evaluation. No lymphadenopathy. No lumps palpated bilateral breasts. No complaints of pain or tenderness on exam.      Pelvic/Bimanual Pap is not indicated today per BCCCP guidelines.   Smoking History: Patient has never smoked.   Patient Navigation: Patient education provided. Access to services provided for patient through BCCCP program.    Breast and Cervical Cancer Risk Assessment: Patient has family history of mother having breast cancer. Patient has no known genetic mutations or history of radiation treatment to the chest before age 33. Per patient has history of cervical dysplasia. Patient has no history of being immunocompromised or DES exposure in-utero.  Risk Assessment     Risk Scores       11/16/2021   Last edited by: Demetrius Revel, LPN    5-year risk: 0.4 %   Lifetime risk: 14.3 %            A: BCCCP exam without pap smear Complaint of bilateral nipple discharge.  P: Referred patient to the [facility] for a diagnostic mammogram. Appointment scheduled Tuesday, November 16, 2021 at 1450.  Loletta Parish, RN 11/16/2021 12:54 PM

## 2021-11-16 NOTE — Patient Instructions (Signed)
Explained breast self awareness with Betsey Amen. Patient did not need a Pap smear today due to last Pap smear and HPV typing was 06/21/2019. Let her know BCCCP will cover Pap smears and HPV typing every 5 years unless has a history of abnormal Pap smears. Referred patient to the [facility] for a diagnostic mammogram. Appointment scheduled Tuesday, November 16, 2021 at 1450. Patient aware of appointment and will be there. Let patient know will follow-up with her within the next couple of weeks with results of her breast discharge by phone. Betsey Amen verbalized understanding.  Chyna Kneece, Arvil Chaco, RN 12:54 PM

## 2021-11-17 LAB — CYTOLOGY - NON PAP

## 2021-11-22 ENCOUNTER — Telehealth: Payer: Self-pay

## 2021-11-22 NOTE — Telephone Encounter (Signed)
11/19/2021. Via Lavon Paganini, Spanish Interpreter [facility]), Patient informed no malignant cells in bilateral breast discharge. Patient verbalized understanding.

## 2021-12-10 ENCOUNTER — Other Ambulatory Visit: Payer: Self-pay

## 2021-12-13 ENCOUNTER — Other Ambulatory Visit: Payer: Self-pay

## 2021-12-14 ENCOUNTER — Other Ambulatory Visit: Payer: Self-pay

## 2022-01-05 ENCOUNTER — Telehealth: Payer: Self-pay | Admitting: Internal Medicine

## 2022-01-05 NOTE — Telephone Encounter (Signed)
I return Pt call, LVM to call me back on Monday

## 2022-01-06 ENCOUNTER — Telehealth: Payer: No Typology Code available for payment source | Admitting: Nurse Practitioner

## 2022-01-06 ENCOUNTER — Other Ambulatory Visit: Payer: Self-pay

## 2022-01-06 ENCOUNTER — Ambulatory Visit: Payer: Self-pay

## 2022-01-06 DIAGNOSIS — H6690 Otitis media, unspecified, unspecified ear: Secondary | ICD-10-CM

## 2022-01-06 DIAGNOSIS — J069 Acute upper respiratory infection, unspecified: Secondary | ICD-10-CM

## 2022-01-06 MED ORDER — AZITHROMYCIN 250 MG PO TABS
ORAL_TABLET | ORAL | 0 refills | Status: AC
Start: 2022-01-06 — End: 2022-01-12
  Filled 2022-01-06: qty 6, 5d supply, fill #0

## 2022-01-06 NOTE — Telephone Encounter (Signed)
Patient called, left VM to return the call to the office to discuss symptoms with a nurse.   Summary: ear clogged   Pt called in for assistance. Pt says that her ear is clogged Offered pt next available appt, (March) pt says that she is unsure of what she should do meanwhile?   Pt  would like to speak with a nurse further for assistance

## 2022-01-06 NOTE — Progress Notes (Signed)
Virtual Visit Consent   Melissa Quinn, you are scheduled for a virtual visit with a [facility] provider today.     Just as with appointments in the office, your consent must be obtained to participate.  Your consent will be active for this visit and any virtual visit you may have with one of our providers in the next 365 days.     If you have a MyChart account, a copy of this consent can be sent to you electronically.  All virtual visits are billed to your insurance company just like a traditional visit in the office.    As this is a virtual visit, video technology does not allow for your provider to perform a traditional examination.  This may limit your provider's ability to fully assess your condition.  If your provider identifies any concerns that need to be evaluated in person or the need to arrange testing (such as labs, EKG, etc.), we will make arrangements to do so.     Although advances in technology are sophisticated, we cannot ensure that it will always work on either your end or our end.  If the connection with a video visit is poor, the visit may have to be switched to a telephone visit.  With either a video or telephone visit, we are not always able to ensure that we have a secure connection.     I need to obtain your verbal consent now.   Are you willing to proceed with your visit today?    Melissa Quinn has provided verbal consent on 01/06/2022 for a virtual visit (video or telephone).   Apolonio Schneiders, FNP   Date: 01/06/2022 4:28 PM   Virtual Visit via Video Note   I, Apolonio Schneiders, connected with  Melissa Quinn  ([id number], 1986/07/29) on 01/06/22 at  4:30 PM EST by a video-enabled telemedicine application and verified that I am speaking with the correct person using two identifiers.  Location: Patient: Virtual Visit Location Patient: Home Provider: Virtual Visit Location Provider: Office/Clinic   I discussed the limitations of evaluation and  management by telemedicine and the availability of in person appointments. The patient expressed understanding and agreed to proceed.    History of Present Illness: Melissa Quinn is a 36 y.o. who identifies as a female who was assigned female at birth, and is being seen today with complaints of a cough and runny nose for the past week, she started to notice her ears were popping since the first day, and today her left ear is now very full and has more pressure. She does have some sharp pain on the left as well   She denies a fever.   She has not taken a COVID test.   She has been using Mucinex OTC for relief.  She has also used Coricidin HBP for relief as well  Her cough has improved, she does have PND.   She has had an ear infection in the past   She denies a history of asthma   Denies any change of pregnancy she has had tubal procedure  Problems:  Patient Active Problem List   Diagnosis Date Noted   Prediabetes 10/24/2019   Non-seasonal allergic rhinitis 10/24/2019   Essential hypertension 06/27/2017   Benign mole 06/27/2017   History of severe pre-eclampsia 09/22/2016   Chronic hypertension with superimposed preeclampsia 06/28/2016    Allergies:  Allergies  Allergen Reactions   Penicillins Hives    Has  patient had a PCN reaction causing immediate rash, facial/tongue/throat swelling, SOB or lightheadedness with hypotension: Yes Has patient had a PCN reaction causing severe rash involving mucus membranes or skin necrosis: No Has patient had a PCN reaction that required hospitalization Yes Has patient had a PCN reaction occurring within the last 10 years: No If all of the above answers are "NO", then may proceed with Cephalosporin use.    Medications:  Current Outpatient Medications:    amLODipine (NORVASC) 10 MG tablet, TAKE 1 TABLET (10 MG TOTAL) BY MOUTH DAILY., Disp: 90 tablet, Rfl: 3  Observations/Objective: Patient is well-developed, well-nourished in no acute  distress.  Resting comfortably at home.  Head is normocephalic, atraumatic.  No labored breathing.  Speech is clear and coherent with logical content.  Patient is alert and oriented at baseline.    Assessment and Plan: 1. Viral upper respiratory tract infection Continue over the counter regimen with Mucinex and Coricidin for congestion relief. May also use saline spray   2. Acute otitis media, unspecified otitis media type  - azithromycin (ZITHROMAX) 250 MG tablet; Take 2 tablets on day 1, then 1 tablet daily on days 2 through 5  Dispense: 6 tablet; Refill: 0     Follow Up Instructions: I discussed the assessment and treatment plan with the patient. The patient was provided an opportunity to ask questions and all were answered. The patient agreed with the plan and demonstrated an understanding of the instructions.  A copy of instructions were sent to the patient via MyChart unless otherwise noted below.   The patient was advised to call back or seek an in-person evaluation if the symptoms worsen or if the condition fails to improve as anticipated.  Time:  I spent 10 minutes with the patient via telehealth technology discussing the above problems/concerns.    Apolonio Schneiders, FNP

## 2022-01-06 NOTE — Telephone Encounter (Signed)
°  Chief Complaint: ear congestion Symptoms: both ears congestion but L ear worse, cough, runny nose Frequency: since monday Pertinent Negatives: NA Disposition: [] ED /[] Urgent Care (no appt availability in office) / [] Appointment(In office/virtual)/ [x]  [facility]/ [] Home Care/ [] Refused Recommended Disposition /[] [facility]/ []  Follow-up with PCP Additional Notes: No availability with office in person or virtual   Reason for Disposition  Ear congestion present > 48 hours  Answer Assessment - Initial Assessment Questions 1. LOCATION: "Which ear is involved?"       Both L ear worse 2. SENSATION: "Describe how the ear feels." (e.g. stuffy, full, plugged)."      Fullness 3. ONSET:  "When did the ear symptoms start?"       Started Monday  4. PAIN: "Do you also have an earache?" If Yes, ask: "How bad is it?" (Scale 1-10; or mild, moderate, severe)     discomfort 6. URI: "Do you have a runny nose or cough?"      yes  Protocols used: Ear - Congestion-A-AH

## 2022-01-07 ENCOUNTER — Other Ambulatory Visit: Payer: Self-pay

## 2022-01-19 ENCOUNTER — Ambulatory Visit: Payer: Self-pay | Attending: Internal Medicine

## 2022-01-19 ENCOUNTER — Other Ambulatory Visit: Payer: Self-pay

## 2022-02-07 ENCOUNTER — Ambulatory Visit: Payer: Self-pay | Admitting: Internal Medicine

## 2022-02-16 ENCOUNTER — Other Ambulatory Visit: Payer: Self-pay

## 2022-02-17 ENCOUNTER — Other Ambulatory Visit: Payer: Self-pay

## 2022-02-18 ENCOUNTER — Other Ambulatory Visit: Payer: Self-pay

## 2022-03-24 ENCOUNTER — Other Ambulatory Visit: Payer: Self-pay

## 2022-03-24 ENCOUNTER — Ambulatory Visit: Payer: Self-pay | Attending: Internal Medicine | Admitting: Internal Medicine

## 2022-03-24 ENCOUNTER — Encounter: Payer: Self-pay | Admitting: Internal Medicine

## 2022-03-24 VITALS — BP 139/86 | HR 77 | Resp 16 | Wt 170.2 lb

## 2022-03-24 DIAGNOSIS — E669 Obesity, unspecified: Secondary | ICD-10-CM

## 2022-03-24 DIAGNOSIS — K068 Other specified disorders of gingiva and edentulous alveolar ridge: Secondary | ICD-10-CM

## 2022-03-24 DIAGNOSIS — I10 Essential (primary) hypertension: Secondary | ICD-10-CM

## 2022-03-24 MED ORDER — AMLODIPINE BESYLATE 10 MG PO TABS
ORAL_TABLET | Freq: Every day | ORAL | 3 refills | Status: DC
  Filled 2022-07-07: qty 90, 90d supply, fill #0
  Filled 2022-09-26: qty 90, 90d supply, fill #1
  Filled 2023-01-16: qty 90, 90d supply, fill #2

## 2022-03-24 NOTE — Patient Instructions (Signed)
Healthy Eating ?Following a healthy eating pattern may help you to achieve and maintain a healthy body weight, reduce the risk of chronic disease, and live a long and productive life. It is important to follow a healthy eating pattern at an appropriate calorie level for your body. Your nutritional needs should be met primarily through food by choosing a variety of nutrient-rich foods. ?What are tips for following this plan? ?Reading food labels ?Read labels and choose the following: ?Reduced or low sodium. ?Juices with 100% fruit juice. ?Foods with low saturated fats and high polyunsaturated and monounsaturated fats. ?Foods with whole grains, such as whole wheat, cracked wheat, brown rice, and wild rice. ?Whole grains that are fortified with folic acid. This is recommended for women who are pregnant or who want to become pregnant. ?Read labels and avoid the following: ?Foods with a lot of added sugars. These include foods that contain brown sugar, corn sweetener, corn syrup, dextrose, fructose, glucose, high-fructose corn syrup, honey, invert sugar, lactose, malt syrup, maltose, molasses, raw sugar, sucrose, trehalose, or turbinado sugar. ?Do not eat more than the following amounts of added sugar per day: ?6 teaspoons (25 g) for women. ?9 teaspoons (38 g) for men. ?Foods that contain processed or refined starches and grains. ?Refined grain products, such as white flour, degermed cornmeal, white bread, and white rice. ?Shopping ?Choose nutrient-rich snacks, such as vegetables, whole fruits, and nuts. Avoid high-calorie and high-sugar snacks, such as potato chips, fruit snacks, and candy. ?Use oil-based dressings and spreads on foods instead of solid fats such as butter, stick margarine, or cream cheese. ?Limit pre-made sauces, mixes, and "instant" products such as flavored rice, instant noodles, and ready-made pasta. ?Try more plant-protein sources, such as tofu, tempeh, black beans, edamame, lentils, nuts, and  seeds. ?Explore eating plans such as the Mediterranean diet or vegetarian diet. ?Cooking ?Use oil to saut? or stir-fry foods instead of solid fats such as butter, stick margarine, or lard. ?Try baking, boiling, grilling, or broiling instead of frying. ?Remove the fatty part of meats before cooking. ?Steam vegetables in water or broth. ?Meal planning ? ?At meals, imagine dividing your plate into fourths: ?One-half of your plate is fruits and vegetables. ?One-fourth of your plate is whole grains. ?One-fourth of your plate is protein, especially lean meats, poultry, eggs, tofu, beans, or nuts. ?Include low-fat dairy as part of your daily diet. ?Lifestyle ?Choose healthy options in all settings, including home, work, school, restaurants, or stores. ?Prepare your food safely: ?Wash your hands after handling raw meats. ?Keep food preparation surfaces clean by regularly washing with hot, soapy water. ?Keep raw meats separate from ready-to-eat foods, such as fruits and vegetables. ?Cook seafood, meat, poultry, and eggs to the recommended internal temperature. ?Store foods at safe temperatures. In general: ?Keep cold foods at 40?F (4.4?C) or below. ?Keep hot foods at 140?F (60?C) or above. ?Keep your freezer at 0?F (-17.8?C) or below. ?Foods are no longer safe to eat when they have been between the temperatures of 40?-140?F (4.4-60?C) for more than 2 hours. ?What foods should I eat? ?Fruits ?Aim to eat 2 cup-equivalents of fresh, canned (in natural juice), or frozen fruits each day. Examples of 1 cup-equivalent of fruit include 1 small apple, 8 large strawberries, 1 cup canned fruit, ? cup dried fruit, or 1 cup 100% juice. ?Vegetables ?Aim to eat 2?-3 cup-equivalents of fresh and frozen vegetables each day, including different varieties and colors. Examples of 1 cup-equivalent of vegetables include 2 medium carrots, 2 cups raw,  leafy greens, 1 cup chopped vegetable (raw or cooked), or 1 medium baked potato. ?Grains ?Aim to  eat 6 ounce-equivalents of whole grains each day. Examples of 1 ounce-equivalent of grains include 1 slice of bread, 1 cup ready-to-eat cereal, 3 cups popcorn, or ? cup cooked rice, pasta, or cereal. ?Meats and other proteins ?Aim to eat 5-6 ounce-equivalents of protein each day. Examples of 1 ounce-equivalent of protein include 1 egg, 1/2 cup nuts or seeds, or 1 tablespoon (16 g) peanut butter. A cut of meat or fish that is the size of a deck of cards is about 3-4 ounce-equivalents. ?Of the protein you eat each week, try to have at least 8 ounces come from seafood. This includes salmon, trout, herring, and anchovies. ?Dairy ?Aim to eat 3 cup-equivalents of fat-free or low-fat dairy each day. Examples of 1 cup-equivalent of dairy include 1 cup (240 mL) milk, 8 ounces (250 g) yogurt, 1? ounces (44 g) natural cheese, or 1 cup (240 mL) fortified soy milk. ?Fats and oils ?Aim for about 5 teaspoons (21 g) per day. Choose monounsaturated fats, such as canola and olive oils, avocados, peanut butter, and most nuts, or polyunsaturated fats, such as sunflower, corn, and soybean oils, walnuts, pine nuts, sesame seeds, sunflower seeds, and flaxseed. ?Beverages ?Aim for six 8-oz glasses of water per day. Limit coffee to three to five 8-oz cups per day. ?Limit caffeinated beverages that have added calories, such as soda and energy drinks. ?Limit alcohol intake to no more than 1 drink a day for nonpregnant women and 2 drinks a day for men. One drink equals 12 oz of beer (355 mL), 5 oz of wine (148 mL), or 1? oz of hard liquor (44 mL). ?Seasoning and other foods ?Avoid adding excess amounts of salt to your foods. Try flavoring foods with herbs and spices instead of salt. ?Avoid adding sugar to foods. ?Try using oil-based dressings, sauces, and spreads instead of solid fats. ?This information is based on general [place] nutrition guidelines. For more information, visit [employer]. Exact amounts may vary based on your nutrition  needs. ?Summary ?A healthy eating plan may help you to maintain a healthy weight, reduce the risk of chronic diseases, and stay active throughout your life. ?Plan your meals. Make sure you eat the right portions of a variety of nutrient-rich foods. ?Try baking, boiling, grilling, or broiling instead of frying. ?Choose healthy options in all settings, including home, work, school, restaurants, or stores. ?This information is not intended to replace advice given to you by your health care provider. Make sure you discuss any questions you have with your health care provider. ?Document Revised: 07/27/2021 Document Reviewed: 07/27/2021 ?Elsevier Patient Education ? [place]. ? ?

## 2022-03-24 NOTE — Progress Notes (Signed)
? ? ?Patient ID: Melissa Quinn, female    DOB: 01-09-1986  MRN: [id number] ? ?CC: Hypertension and Dental Pain ? ? ?Subjective: ?Melissa Quinn is a 36 y.o. female who presents for chronic ds management ?Her concerns today include:  ?Patient with history of HTN, preDM, HL. ? ?C/o dental pain on the RT lower gum x 1 mth. ?Started after she was eating a chip and the pointed end of it sort of cut the gum ?Had appt scheduled with dentist this wk but their office cancel due to an unforeseen issue ? ?Menstrual change: usually comes on around the 25th of the mth and last until the beginning of the next mth. ?Last menses started on 03/09/2022 and ended 03/13/22.  She wanted to know whether this is a normal cycle since she usually comes on around the 25th but the last time she did not come until the 29th. ? ?HTN:  forgot to take Norvasc in last 2 days.  Tolerating the med okay.  Limits salt.  No CP/SOB ? ?Obesity:  last wgh a few wks ago was 166. ?Walking daily at the park for about 1 hr with her kids. ?Doing better with eating habits.  Has cut out a lot of sugar.  Getting in fruits and veggies daily ? ?Patient Active Problem List  ? Diagnosis Date Noted  ? Prediabetes 10/24/2019  ? Non-seasonal allergic rhinitis 10/24/2019  ? Essential hypertension 06/27/2017  ? Benign mole 06/27/2017  ? History of severe pre-eclampsia 09/22/2016  ? Chronic hypertension with superimposed preeclampsia 06/28/2016  ?  ? ?No current outpatient medications on file prior to visit.  ? ?No current facility-administered medications on file prior to visit.  ? ? ?Allergies  ?Allergen Reactions  ? Penicillins Hives  ?  Has patient had a PCN reaction causing immediate rash, facial/tongue/throat swelling, SOB or lightheadedness with hypotension: Yes ?Has patient had a PCN reaction causing severe rash involving mucus membranes or skin necrosis: No ?Has patient had a PCN reaction that required hospitalization Yes ?Has patient had a PCN reaction  occurring within the last 10 years: No ?If all of the above answers are "NO", then may proceed with Cephalosporin use. ?  ? ? ?Social History  ? ?Socioeconomic History  ? Marital status: Married  ?  Spouse name: Not on file  ? Number of children: 5  ? Years of education: Not on file  ? Highest education level: 7th grade  ?Occupational History  ? Not on file  ?Tobacco Use  ? Smoking status: Never  ? Smokeless tobacco: Never  ?Substance and Sexual Activity  ? Alcohol use: No  ? Drug use: No  ? Sexual activity: Yes  ?  Birth control/protection: Surgical  ?Other Topics Concern  ? Not on file  ?Social History Narrative  ? Not on file  ? ?Social Determinants of Health  ? ?Financial Resource Strain: Not on file  ?Food Insecurity: No Food Insecurity  ? Worried About Charity fundraiser in the Last Year: Never true  ? Ran Out of Food in the Last Year: Never true  ?Transportation Needs: No Transportation Needs  ? Lack of Transportation (Medical): No  ? Lack of Transportation (Non-Medical): No  ?Physical Activity: Not on file  ?Stress: Not on file  ?Social Connections: Not on file  ?Intimate Partner Violence: Not on file  ? ? ?Family History  ?Problem Relation Age of Onset  ? Diabetes Mother   ? Hypertension Mother   ? Breast cancer  Mother   ? Hypertension Sister   ? Asthma Son   ? Diabetes Maternal Grandmother   ? ? ?Past Surgical History:  ?Procedure Laterality Date  ? CESAREAN SECTION N/A 10/23/2013  ? Procedure: Primary Cesarean Section Delivery Baby Girl @ 0004, Apgars 8/8;  Surgeon: Jonnie Kind, MD;  Location: [facility];  Service: Obstetrics;  Laterality: N/A;  ? TUBAL LIGATION Bilateral 01/02/2017  ? Procedure: POST PARTUM TUBAL LIGATION;  Surgeon: Woodroe Mode, MD;  Location: [facility];  Service: Gynecology;  Laterality: Bilateral;  ? ? ?ROS: ?Review of Systems ?Negative except as stated above ? ?PHYSICAL EXAM: ?BP 139/86   Pulse 77   Resp 16   Wt 170 lb 3.2 oz (77.2 kg)   SpO2 95%   BMI 31.13 kg/m?   ?Wt Readings  from Last 3 Encounters:  ?03/24/22 170 lb 3.2 oz (77.2 kg)  ?11/16/21 162 lb 1.6 oz (73.5 kg)  ?10/07/21 166 lb 3.2 oz (75.4 kg)  ? ? ?Physical Exam ? ?General appearance - alert, well appearing, and in no distress ?Mental status - normal mood, behavior, speech, dress, motor activity, and thought processes ?Mouth -patient with good dental hygiene.  She has a small area of irritation in the right lower jaw of the gum adjacent to the third molar.  Almost looks like a small canker ulcer.  It is not tender to touch. ?Neck - supple, no significant adenopathy ?Chest - clear to auscultation, no wheezes, rales or rhonchi, symmetric air entry ?Heart - normal rate, regular rhythm, normal S1, S2, no murmurs, rubs, clicks or gallops ?Extremities - peripheral pulses normal, no pedal edema, no clubbing or cyanosis ? ? ? ?  Latest Ref Rng & Units 02/17/2021  ?  8:57 AM 06/21/2019  ?  2:41 PM 03/06/2018  ?  9:14 AM  ?CMP  ?Glucose 65 - 99 mg/dL 91   81   130    ?BUN 6 - 20 mg/dL '13   11   7    '$ ?Creatinine 0.57 - 1.00 mg/dL 0.57   0.66   0.64    ?Sodium 134 - 144 mmol/L 141   139   140    ?Potassium 3.5 - 5.2 mmol/L 4.6   4.2   3.4    ?Chloride 96 - 106 mmol/L 104   104   104    ?CO2 20 - 29 mmol/L '22   23   26    '$ ?Calcium 8.7 - 10.2 mg/dL 9.2   9.2   9.1    ?Total Protein 6.0 - 8.5 g/dL 7.5      ?Total Bilirubin 0.0 - 1.2 mg/dL 0.3      ?Alkaline Phos 44 - 121 IU/L 84      ?AST 0 - 40 IU/L 16      ?ALT 0 - 32 IU/L 25      ? ?Lipid Panel  ?   ?Component Value Date/Time  ? CHOL 200 (H) 02/17/2021 0857  ? TRIG 186 (H) 02/17/2021 0857  ? HDL 59 02/17/2021 0857  ? CHOLHDL 3.4 02/17/2021 0857  ? LDLCALC 109 (H) 02/17/2021 0857  ? ? ?CBC ?   ?Component Value Date/Time  ? WBC 8.5 02/17/2021 0857  ? WBC 8.6 03/06/2018 0914  ? RBC 4.30 02/17/2021 0857  ? RBC 4.62 03/06/2018 0914  ? HGB 13.2 02/17/2021 0857  ? HCT 39.4 02/17/2021 0857  ? PLT 335 02/17/2021 0857  ? MCV 92 02/17/2021 0857  ? MCH 30.7 02/17/2021  0857  ? MCH 30.5 03/06/2018 0914  ?  MCHC 33.5 02/17/2021 0857  ? MCHC 33.7 03/06/2018 0914  ? RDW 13.1 02/17/2021 0857  ? LYMPHSABS 2,850 06/28/2016 1005  ? MONOABS 570 06/28/2016 1005  ? EOSABS 190 06/28/2016 1005  ? BASOSABS 0 06/28/2016 1005  ? ? ?ASSESSMENT AND PLAN: ?1. Essential hypertension ?Not at goal.  She has not taken her medicine in 2 days.  Encouraged her to get a med box/pillbox.  Continue Norvasc 10 mg daily. ?- amLODipine (NORVASC) 10 MG tablet; TAKE 1 TABLET (10 MG TOTAL) BY MOUTH DAILY.  Dispense: 90 tablet; Refill: 3 ?- CBC; Future ?- Lipid panel; Future ?- Comprehensive metabolic panel; Future ? ?2. Pain in gums ?Advise seeing the dentist, it sounds like a part of the potato chip that she was eating may have gotten caught in that area.  However no foreign object felt on palpation of the gum. ?I will submit the referral ? ?3. Obesity (BMI 30.0-34.9) ?Encouraged her to continue healthy eating habits.  Printed information given.  Continue regular exercise. ?- Hemoglobin A1c; Future ? ?Advised patient that based on the information that she has given me, her menstrual cycles are regular.  Regular cycle can occur anywhere from 25 to 30 days. ? ?Patient was given the opportunity to ask questions.  Patient verbalized understanding of the plan and was able to repeat key elements of the plan.  ? ?This documentation was completed using Radio producer.  Any transcriptional errors are unintentional. ? ?Orders Placed This Encounter  ?Procedures  ? CBC  ? Lipid panel  ? Hemoglobin A1c  ? Comprehensive metabolic panel  ? ? ? ?Requested Prescriptions  ? ?Signed Prescriptions Disp Refills  ? amLODipine (NORVASC) 10 MG tablet 90 tablet 3  ?  Sig: TAKE 1 TABLET (10 MG TOTAL) BY MOUTH DAILY.  ? ? ?Return in about 4 months (around 07/24/2022). ? ?Karle Plumber, MD, FACP ?

## 2022-07-08 ENCOUNTER — Other Ambulatory Visit: Payer: Self-pay

## 2022-08-01 ENCOUNTER — Ambulatory Visit: Payer: No Typology Code available for payment source | Admitting: Internal Medicine

## 2022-09-26 ENCOUNTER — Other Ambulatory Visit: Payer: Self-pay

## 2022-09-27 ENCOUNTER — Other Ambulatory Visit: Payer: Self-pay

## 2022-09-28 ENCOUNTER — Other Ambulatory Visit: Payer: Self-pay

## 2022-10-04 ENCOUNTER — Other Ambulatory Visit: Payer: Self-pay

## 2022-10-10 IMAGING — MG DIGITAL DIAGNOSTIC BILAT W/ TOMO W/ CAD
8 of 15 series · 9 of 40 positions shown · non-contrast
Comparison: None.

CLINICAL DATA: Patient presents for evaluation of bilateral nipple
discharge, green and whitish, predominately with expression.

EXAM:
DIGITAL DIAGNOSTIC BILATERAL MAMMOGRAM WITH TOMOSYNTHESIS AND CAD
TECHNIQUE: Bilateral digital diagnostic mammography and breast tomosynthesis
was performed. The images were evaluated with computer-aided
detection.

[L CC synth-2D]
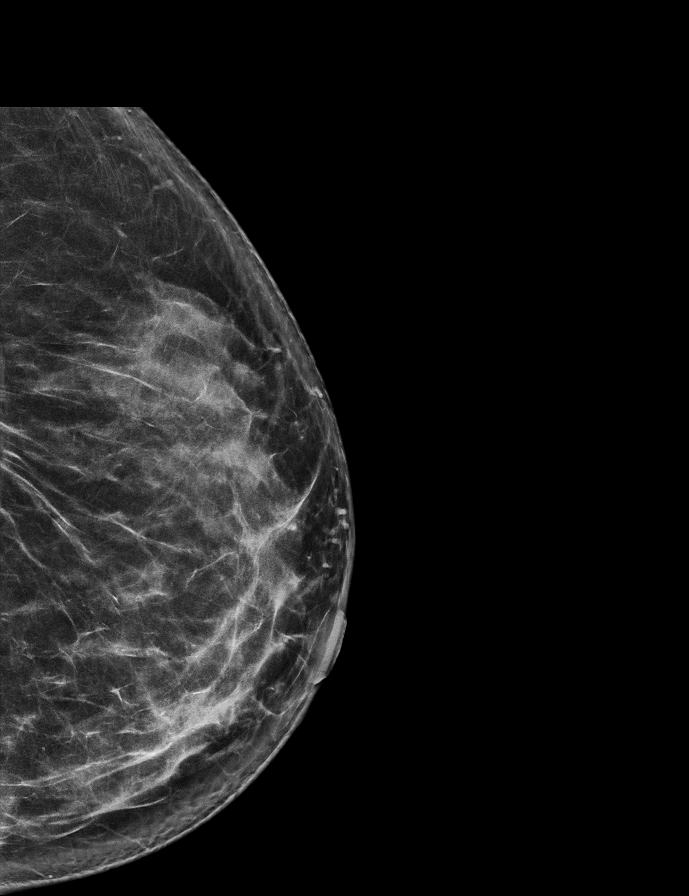

[L MLO synth-2D (1 of 2)]
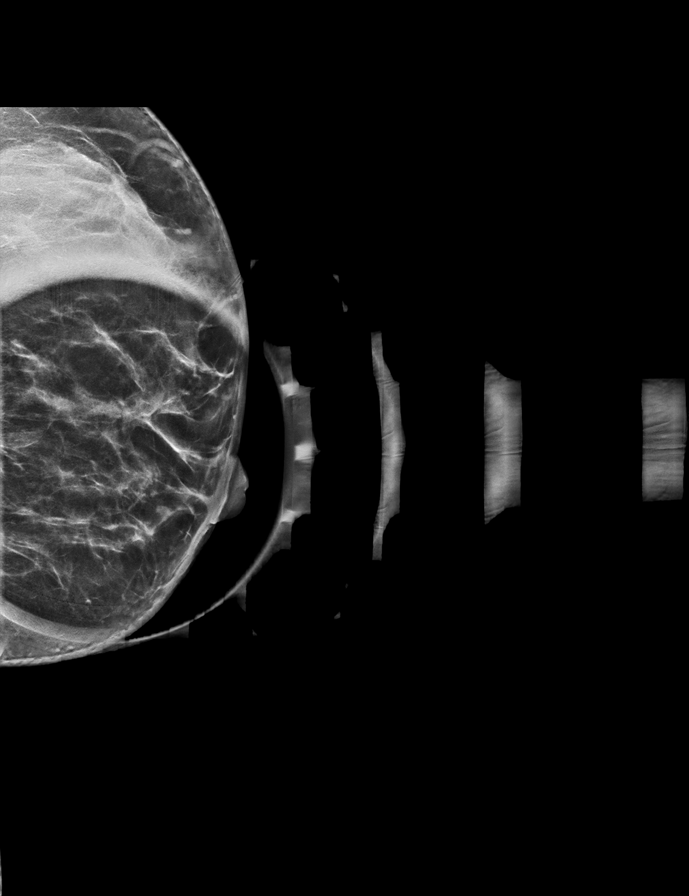

[L MLO synth-2D (2 of 2)]
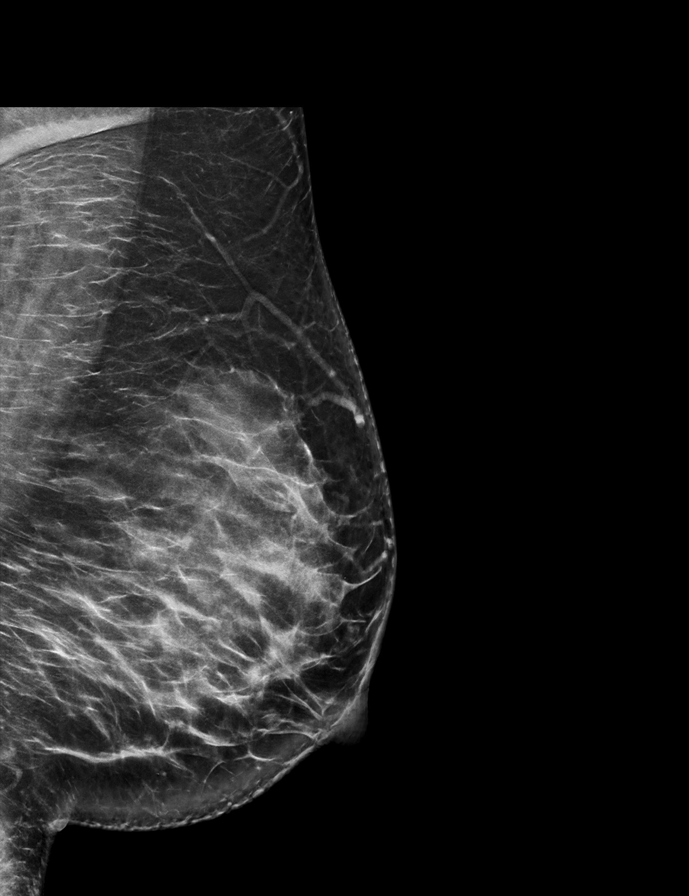

[R MLO synth-2D (1 of 3)]
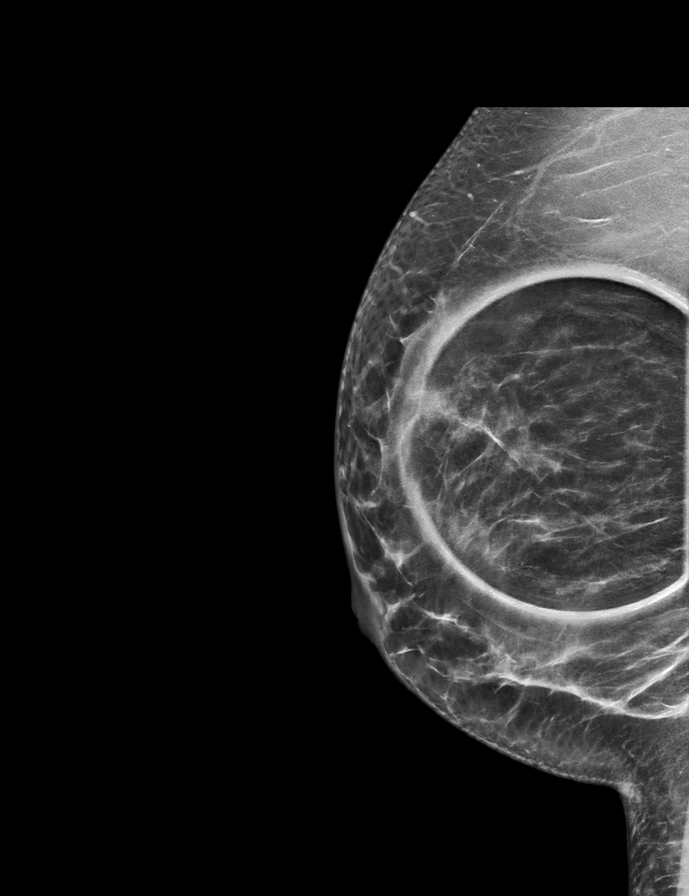

[R CC synth-2D]
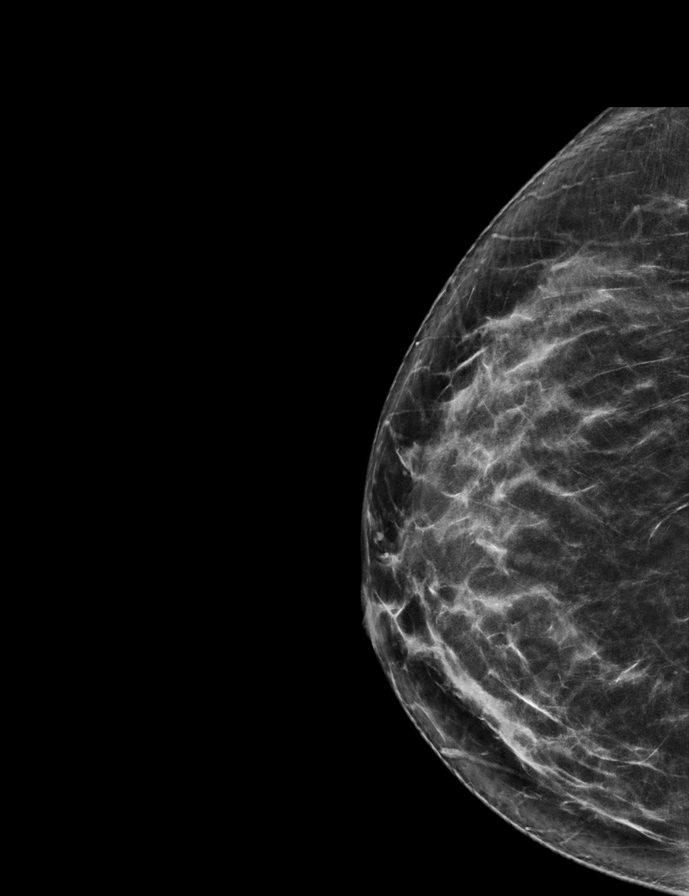

[R MLO synth-2D (2 of 3)]
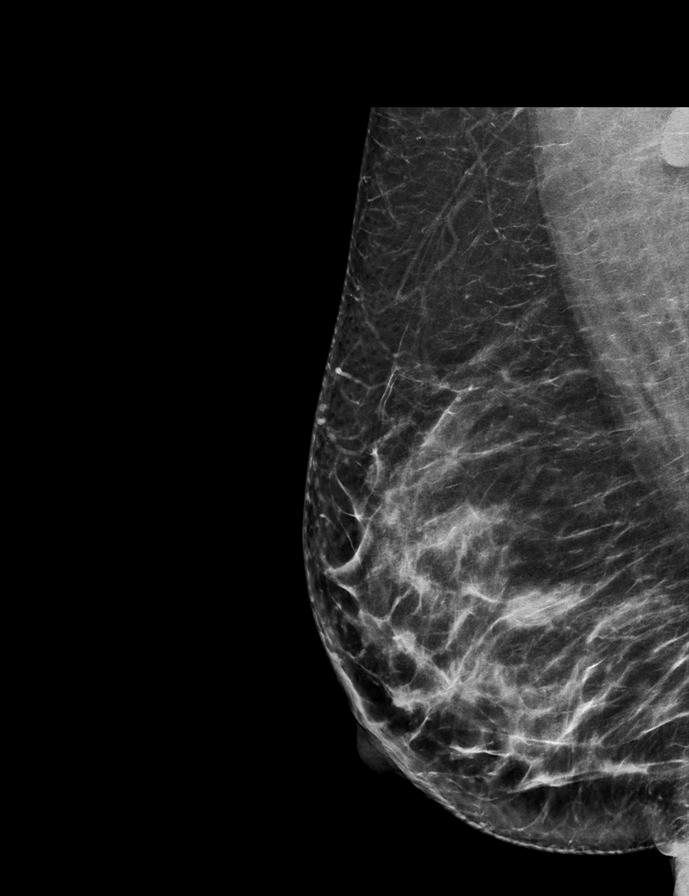

[R MLO synth-2D (3 of 3)]
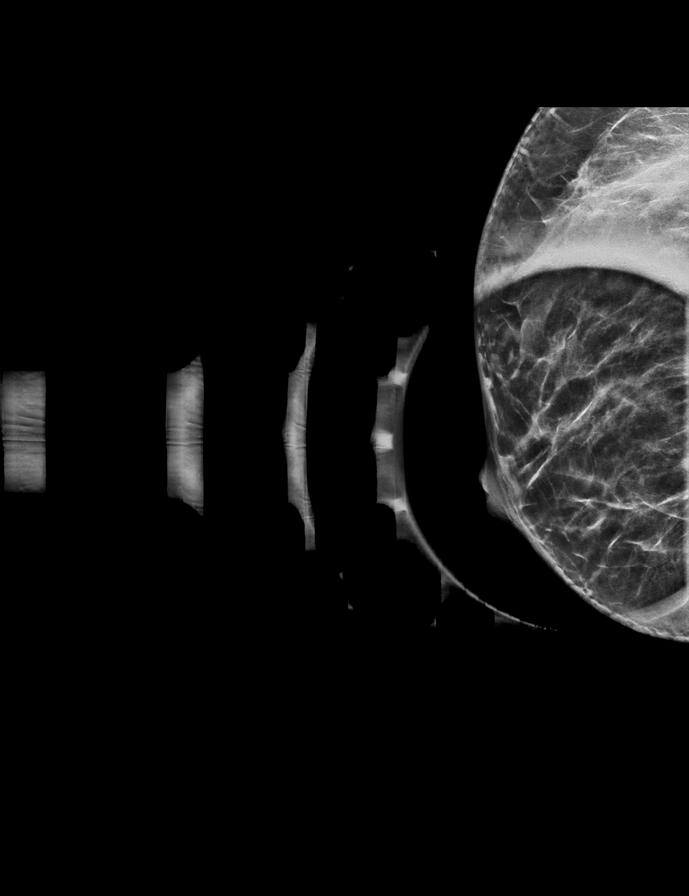

[R ML tomo · 2 of 64 frames shown]
[frame 33/64]
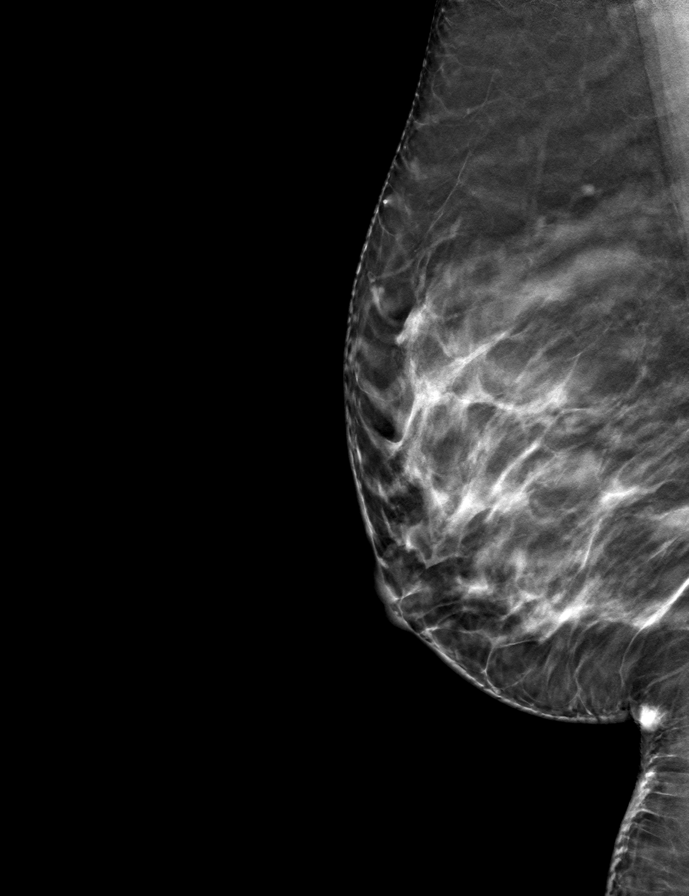
[frame 44/64]
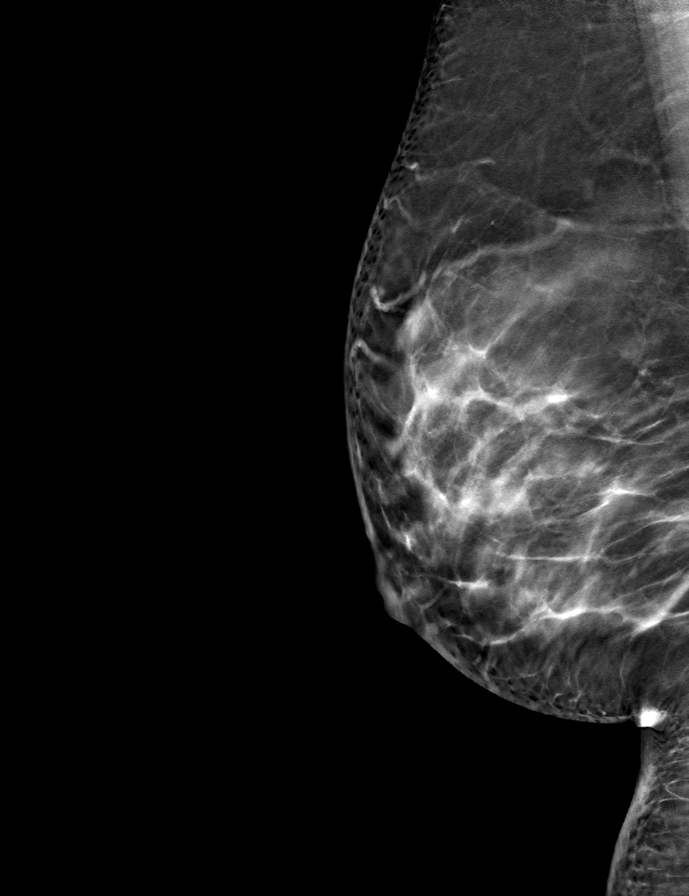

[9 of 40 positions shown; findings below may reference images not displayed]

ACR Breast Density Category c: The breast tissue is heterogeneously
dense, which may obscure small masses.
FINDINGS: No concerning masses, calcifications or distortion identified within
either breast.
IMPRESSION: No mammographic evidence for malignancy.

Likely physiologic nipple discharge given bilateral nature, color
and predominately non spontaneous.

RECOMMENDATION:
Continued clinical evaluation for nipple discharge.

Screening mammogram at age 40

I have discussed the findings and recommendations with the patient.
If applicable, a reminder letter will be sent to the patient
regarding the next appointment.

BI-RADS CATEGORY  2: Benign.

## 2022-10-11 ENCOUNTER — Ambulatory Visit: Payer: No Typology Code available for payment source | Admitting: Internal Medicine

## 2022-12-09 ENCOUNTER — Ambulatory Visit: Payer: No Typology Code available for payment source | Admitting: Internal Medicine

## 2022-12-14 ENCOUNTER — Ambulatory Visit: Payer: Self-pay | Admitting: Physician Assistant

## 2023-01-16 ENCOUNTER — Other Ambulatory Visit: Payer: Self-pay

## 2023-01-16 ENCOUNTER — Ambulatory Visit: Payer: Self-pay | Attending: Internal Medicine | Admitting: Physician Assistant

## 2023-01-16 VITALS — BP 138/87 | HR 77 | Ht <= 58 in | Wt 161.0 lb

## 2023-01-16 DIAGNOSIS — R29898 Other symptoms and signs involving the musculoskeletal system: Secondary | ICD-10-CM

## 2023-01-16 DIAGNOSIS — M62838 Other muscle spasm: Secondary | ICD-10-CM

## 2023-01-16 DIAGNOSIS — R7303 Prediabetes: Secondary | ICD-10-CM

## 2023-01-16 DIAGNOSIS — E782 Mixed hyperlipidemia: Secondary | ICD-10-CM

## 2023-01-16 DIAGNOSIS — I10 Essential (primary) hypertension: Secondary | ICD-10-CM

## 2023-01-16 MED ORDER — AMLODIPINE BESYLATE 10 MG PO TABS
10.0000 mg | ORAL_TABLET | Freq: Every day | ORAL | 3 refills | Status: DC
  Filled 2023-04-25 (×2): qty 90, 90d supply, fill #0
  Filled 2023-08-08: qty 90, 90d supply, fill #1

## 2023-01-16 MED ORDER — CYCLOBENZAPRINE HCL 5 MG PO TABS
5.0000 mg | ORAL_TABLET | Freq: Three times a day (TID) | ORAL | 1 refills | Status: DC | PRN
  Filled 2023-01-16: qty 30, 10d supply, fill #0

## 2023-01-16 NOTE — Progress Notes (Signed)
Patient ID: Melissa Quinn, female   DOB: [birth year], 37 y.o.   MRN: [id number]   Melissa Quinn, is a 37 y.o. female  JOI:[id number]  OBS:[id number]  DOB - 1986/07/28  Chief Complaint  Patient presents with   Neck Pain    Left side of neck    lab work    Pt states she was due for lab a few months ago and she was unable to complete, She is fasting today        Subjective:   Melissa Quinn is a 37 y.o. female here today for med RF and fasting labs.  She was supposed to have the labs done last April but never came back for them.  Her BP is good OOO-usu about 120/80.  She has multiple stressors currently.  Her mom has CA, she works and takes care of her daughter.  She has been having clicking of her jaw at times.  Not painful.  She is also having muscle spasm in her L upper neck after reading or looking down for extended periods of time.    No problems updated.  ALLERGIES: Allergies  Allergen Reactions   Penicillins Hives    Has patient had a PCN reaction causing immediate rash, facial/tongue/throat swelling, SOB or lightheadedness with hypotension: Yes Has patient had a PCN reaction causing severe rash involving mucus membranes or skin necrosis: No Has patient had a PCN reaction that required hospitalization Yes Has patient had a PCN reaction occurring within the last 10 years: No If all of the above answers are "NO", then may proceed with Cephalosporin use.     PAST MEDICAL HISTORY: Past Medical History:  Diagnosis Date   Abnormal Pap smear    colpo 2013   Chlamydia 2011   Chronic hypertension 09/2013   Pregnancy induced hypertension     MEDICATIONS AT HOME: Prior to Admission medications   Medication Sig Start Date End Date Taking? Authorizing Provider  cyclobenzaprine (FLEXERIL) 5 MG tablet Take 1 tablet (5 mg total) by mouth 3 (three) times daily as needed for muscle spasms. 01/16/23  Yes Manas Hickling M, PA-C  amLODipine (NORVASC) 10 MG tablet TAKE 1  TABLET (10 MG TOTAL) BY MOUTH DAILY. 01/16/23 01/16/24  Argentina Donovan, PA-C    ROS: Neg resp Neg cardiac Neg GI Neg GU Neg psych Neg neuro  Objective:   Vitals:   01/16/23 0909  BP: 138/87  Pulse: 77  SpO2: 97%  Weight: 161 lb (73 kg)  Height: '4\' 9"'$  (1.448 m)   Exam General appearance : Awake, alert, not in any distress. Speech Clear. Not toxic looking HEENT: Atraumatic and Normocephalic.  Mild TTP B TMJ.   Neck: Supple, no JVD. No cervical lymphadenopathy.  Chest: Good air entry bilaterally, CTAB.  No rales/rhonchi/wheezing CVS: S1 S2 regular, no murmurs.  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  Dispense: 30 tablet; Refill: 1  2. Muscle spasm - cyclobenzaprine (FLEXERIL) 5  MG tablet; Take 1 tablet (5 mg total) by mouth 3 (three) times daily as needed for muscle spasms.  Dispense: 30 tablet; Refill: 1  3. Mixed hyperlipidemia - Lipid panel - Comprehensive metabolic panel - CBC with Differential/Platelet  4. Essential hypertension Controlled OOO - Comprehensive metabolic panel - CBC with Differential/Platelet - amLODipine (NORVASC) 10 MG tablet; TAKE 1 TABLET (10 MG TOTAL) BY MOUTH DAILY.  Dispense: 90 tablet; Refill: 3  5. Prediabetes I have had a lengthy discussion and provided education about insulin resistance and the intake of too much sugar/refined carbohydrates.  I have advised the patient to work at a goal of eliminating sugary drinks, candy, desserts, sweets, refined sugars,  processed foods, and white carbohydrates.  The patient expresses understanding.   - Comprehensive metabolic panel - Hemoglobin A1c - CBC with Differential/Platelet    Return in about 6 months (around 07/17/2023) for PCP for chronic conditions.  The patient was given clear instructions to go to ER or return to medical center if symptoms don't improve, worsen or new problems develop. The patient verbalized understanding. The patient was told to call to get lab results if they haven't heard anything in the next week.      Freeman Caldron, PA-C [facility] [place], [place]   01/16/2023, 9:38 AM

## 2023-01-16 NOTE — Patient Instructions (Addendum)
Goal blood pressure (average) <130/<85  Temporomandibular Joint Syndrome  Temporomandibular joint syndrome (TMJ syndrome) is a condition that causes pain in the temporomandibular joints. These joints are located near your ears and allow your jaw to open and close. For people with TMJ syndrome, chewing, biting, or other movements of the jaw can be difficult or painful. TMJ syndrome is often mild and goes away within a few weeks. However, sometimes the condition becomes a long-term (chronic) problem. What are the causes? This condition may be caused by: Grinding your teeth or clenching your jaw. Some people do this when they are stressed. Arthritis. An injury to the jaw. A head or neck injury. Teeth or dentures that are not aligned well. In some cases, the cause of TMJ syndrome may not be known. What are the signs or symptoms? The most common symptom of this condition is aching pain on the side of the head in the area of the TMJ. Other symptoms may include: Pain when moving your jaw, such as when chewing or biting. Not being able to open your jaw all the way. Making a clicking sound when you open your mouth. Headache. Earache. Neck or shoulder pain. How is this diagnosed? This condition may be diagnosed based on: Your symptoms and medical history. A physical exam. Your health care provider may check the range of motion of your jaw. Imaging tests, such as X-rays or an MRI. You may also need to see your dentist, who will check if your teeth and jaw are lined up correctly. How is this treated? TMJ syndrome often goes away on its own. If treatment is needed, it may include: Eating soft foods and applying ice or heat. Medicines to relieve pain or inflammation. Medicines or massage to relax the muscles. A splint, bite plate, or mouthpiece to prevent teeth grinding or jaw clenching. Relaxation techniques or counseling to help reduce stress. A therapy for pain in which an electrical current  is applied to the nerves through the skin (transcutaneous electrical nerve stimulation). Acupuncture. This may help to relieve pain. Jaw surgery. This is rarely needed. Follow these instructions at home:  Eating and drinking Eat a soft diet if you are having trouble chewing. Avoid foods that require a lot of chewing. Do not chew gum. General instructions Take over-the-counter and prescription medicines only as told by your health care provider. If directed, put ice on the painful area. To do this: Put ice in a plastic bag. Place a towel between your skin and the bag. Leave the ice on for 20 minutes, 2-3 times a day. Remove the ice if your skin turns bright red. This is very important. If you cannot feel pain, heat, or cold, you have a greater risk of damage to the area. Apply a warm, wet cloth (warm compress) to the painful area as told. Massage your jaw area and do any jaw stretching exercises as told by your health care provider. If you were given a splint, bite plate, or mouthpiece, wear it as told by your health care provider. Keep all follow-up visits. This is important. Where to find more information [place]: https://avila-olson.com/ Contact a health care provider if: You have trouble eating. You have new or worsening symptoms. Get help right away if: Your jaw locks. Summary Temporomandibular joint syndrome (TMJ syndrome) is a condition that causes pain in the temporomandibular joints. These joints are located near your ears and allow your jaw to open and close. TMJ syndrome is  often mild and goes away within a few weeks. However, sometimes the condition becomes a long-term (chronic) problem. Symptoms include an aching pain on the side of the head in the area of the TMJ, pain when chewing or biting, and being unable to open your jaw all the way. You may also make a clicking sound when you open your mouth. TMJ syndrome often goes away on its  own. If treatment is needed, it may include medicines to relieve pain, reduce inflammation, or relax the muscles. A splint, bite plate, or mouthpiece may also be used to prevent teeth grinding or jaw clenching. This information is not intended to replace advice given to you by your health care provider. Make sure you discuss any questions you have with your health care provider. Document Revised: 07/11/2021 Document Reviewed: 07/11/2021 Elsevier Patient Education  [place].

## 2023-01-17 LAB — COMPREHENSIVE METABOLIC PANEL
ALT: 25 IU/L (ref 0–32)
AST: 21 IU/L (ref 0–40)
Albumin/Globulin Ratio: 1.4 (ref 1.2–2.2)
Albumin: 4.7 g/dL (ref 3.9–4.9)
Alkaline Phosphatase: 93 IU/L (ref 44–121)
BUN/Creatinine Ratio: 11 (ref 9–23)
BUN: 8 mg/dL (ref 6–20)
Bilirubin Total: 0.3 mg/dL (ref 0.0–1.2)
CO2: 22 mmol/L (ref 20–29)
Calcium: 9.6 mg/dL (ref 8.7–10.2)
Chloride: 103 mmol/L (ref 96–106)
Creatinine, Ser: 0.71 mg/dL (ref 0.57–1.00)
Globulin, Total: 3.3 g/dL (ref 1.5–4.5)
Glucose: 88 mg/dL (ref 70–99)
Potassium: 4.4 mmol/L (ref 3.5–5.2)
Sodium: 142 mmol/L (ref 134–144)
Total Protein: 8 g/dL (ref 6.0–8.5)
eGFR: 113 mL/min/{1.73_m2} (ref >59)

## 2023-01-17 LAB — CBC WITH DIFFERENTIAL/PLATELET
Basophils Absolute: 0.1 10*3/uL (ref 0.0–0.2)
Basos: 1 %
EOS (ABSOLUTE): 0.3 10*3/uL (ref 0.0–0.4)
Eos: 5 %
Hematocrit: 41.3 % (ref 34.0–46.6)
Hemoglobin: 13.6 g/dL (ref 11.1–15.9)
Immature Grans (Abs): 0 10*3/uL (ref 0.0–0.1)
Immature Granulocytes: 0 %
Lymphocytes Absolute: 2.7 10*3/uL (ref 0.7–3.1)
Lymphs: 36 %
MCH: 30 pg (ref 26.6–33.0)
MCHC: 32.9 g/dL (ref 31.5–35.7)
MCV: 91 fL (ref 79–97)
Monocytes Absolute: 0.5 10*3/uL (ref 0.1–0.9)
Monocytes: 6 %
Neutrophils Absolute: 4 10*3/uL (ref 1.4–7.0)
Neutrophils: 52 %
Platelets: 366 10*3/uL (ref 150–450)
RBC: 4.53 x10E6/uL (ref 3.77–5.28)
RDW: 13.1 % (ref 11.7–15.4)
WBC: 7.6 10*3/uL (ref 3.4–10.8)

## 2023-01-17 LAB — LIPID PANEL
Chol/HDL Ratio: 3.6 ratio (ref 0.0–4.4)
Cholesterol, Total: 227 mg/dL — ABNORMAL HIGH (ref 100–199)
HDL: 63 mg/dL (ref >39)
LDL Chol Calc (NIH): 137 mg/dL — ABNORMAL HIGH (ref 0–99)
Triglycerides: 151 mg/dL — ABNORMAL HIGH (ref 0–149)
VLDL Cholesterol Cal: 27 mg/dL (ref 5–40)

## 2023-01-17 LAB — HEMOGLOBIN A1C
Est. average glucose Bld gHb Est-mCnc: 126 mg/dL
Hgb A1c MFr Bld: 6 % — ABNORMAL HIGH (ref 4.8–5.6)

## 2023-01-17 NOTE — Progress Notes (Signed)
Let patient know that her cholesterol levels are still elevated.  She is still in the range for prediabetes.  Healthy eating habits and trying to exercise at least 5 days a week for 30 minutes will help to lower cholesterol and prevent progression to full diabetes.  Kidney and liver function tests are normal.

## 2023-01-20 ENCOUNTER — Other Ambulatory Visit: Payer: Self-pay

## 2023-04-19 ENCOUNTER — Other Ambulatory Visit: Payer: Self-pay | Admitting: Internal Medicine

## 2023-04-19 DIAGNOSIS — I10 Essential (primary) hypertension: Secondary | ICD-10-CM

## 2023-04-19 NOTE — Telephone Encounter (Signed)
Rx was sent to [facility] on 01/27/23 #90/3 RF  Requested Prescriptions  Pending Prescriptions Disp Refills   amLODipine (NORVASC) 10 MG tablet 90 tablet 3    Sig: TAKE 1 TABLET (10 MG TOTAL) BY MOUTH DAILY.     Cardiovascular: Calcium Channel Blockers 2 Passed - 04/19/2023  5:18 PM      Passed - Last BP in normal range    BP Readings from Last 1 Encounters:  01/16/23 138/87         Passed - Last Heart Rate in normal range    Pulse Readings from Last 1 Encounters:  01/16/23 77         Passed - Valid encounter within last 6 months    Recent Outpatient Visits           3 months ago TMJ click   [facility] [place], [place], [place]   1 year ago Essential hypertension   [place] [facility] Marcine Matar, MD   1 year ago Essential hypertension   [place] [facility] Marcine Matar, MD   1 year ago Essential hypertension   [place] [facility] Marcine Matar, MD   2 years ago Essential hypertension   [place] [facility] Marcine Matar, MD       Future Appointments             In 2 months Laural Benes, [name], MD [facility]

## 2023-04-20 ENCOUNTER — Other Ambulatory Visit: Payer: Self-pay

## 2023-04-25 ENCOUNTER — Other Ambulatory Visit: Payer: Self-pay

## 2023-04-25 ENCOUNTER — Other Ambulatory Visit: Payer: Self-pay | Admitting: Internal Medicine

## 2023-04-25 ENCOUNTER — Telehealth: Payer: Self-pay | Admitting: Internal Medicine

## 2023-04-25 DIAGNOSIS — I10 Essential (primary) hypertension: Secondary | ICD-10-CM

## 2023-04-25 NOTE — Telephone Encounter (Signed)
Copied from CRM [id number]. Topic: General - Other >> Apr 25, 2023  8:32 AM Everette C wrote: Reason for CRM: Medication Refill - Medication: Rx #: [id number]  amLODipine (NORVASC) 10 MG tablet [[id number]]    Has the patient contacted their pharmacy? Yes.   (Agent: If no, request that the patient contact the pharmacy for the refill. If patient does not wish to contact the pharmacy document the reason why and proceed with request.) (Agent: If yes, when and what did the pharmacy advise?)  Preferred Pharmacy (with phone number or street name): [facility] [address] [place] [address] Phone: [phone] Fax: [phone] Hours: M-F 7:30a-6:00p   Has the patient been seen for an appointment in the last year OR does the patient have an upcoming appointment? Yes.    Agent: Please be advised that RX refills may take up to 3 business days. We ask that you follow-up with your pharmacy.

## 2023-04-25 NOTE — Telephone Encounter (Signed)
[facility] called and spoke to April, [facility] about the refill(s) amlodipine requested. Advised it was sent on 01/16/23 #90/3 refill(s). She says it's on file and she will get it ready.

## 2023-05-01 ENCOUNTER — Other Ambulatory Visit: Payer: Self-pay

## 2023-07-17 ENCOUNTER — Ambulatory Visit: Payer: Self-pay | Admitting: Internal Medicine

## 2023-08-08 ENCOUNTER — Other Ambulatory Visit: Payer: Self-pay

## 2023-08-15 ENCOUNTER — Other Ambulatory Visit: Payer: Self-pay

## 2023-10-13 ENCOUNTER — Encounter: Payer: Self-pay | Admitting: Internal Medicine

## 2023-10-13 ENCOUNTER — Other Ambulatory Visit: Payer: Self-pay

## 2023-10-13 ENCOUNTER — Ambulatory Visit: Payer: Self-pay | Attending: Internal Medicine | Admitting: Internal Medicine

## 2023-10-13 VITALS — BP 140/80 | HR 87 | Temp 98.2°F | Ht <= 58 in | Wt 164.0 lb

## 2023-10-13 DIAGNOSIS — I10 Essential (primary) hypertension: Secondary | ICD-10-CM

## 2023-10-13 DIAGNOSIS — N6452 Nipple discharge: Secondary | ICD-10-CM

## 2023-10-13 DIAGNOSIS — N644 Mastodynia: Secondary | ICD-10-CM

## 2023-10-13 DIAGNOSIS — J069 Acute upper respiratory infection, unspecified: Secondary | ICD-10-CM

## 2023-10-13 MED ORDER — INFLUENZA VIRUS VACC SPLIT PF (FLUZONE) 0.5 ML IM SUSY
0.5000 mL | PREFILLED_SYRINGE | Freq: Once | INTRAMUSCULAR | 0 refills | Status: AC
  Filled 2023-10-13: qty 0.5, 1d supply, fill #0

## 2023-10-13 MED ORDER — AMLODIPINE BESYLATE 10 MG PO TABS
10.0000 mg | ORAL_TABLET | Freq: Every day | ORAL | 3 refills | Status: DC
  Filled 2023-10-13 (×2): qty 90, 90d supply, fill #0
  Filled 2024-02-28: qty 90, 90d supply, fill #1

## 2023-10-13 NOTE — Progress Notes (Signed)
Patient ID: Melissa Quinn, female    DOB: 07-25-1986  MRN: [id number]  CC: Annual Exam (Physcial. /Pain in R nipple, reports that a "cottage cheese substance" was expelled from the nipple, intermittent episodes of milk leakage & pt stopped breast feeding 6 yrs ago/Instructed to go to our pharmacy for flu vax)   Subjective: Melissa Quinn is a 37 y.o. female who presents for chronic ds management. Her concerns today include:  Patient with history of HTN, preDM, HL.   Patient initially states she had come for a physical.  However after learning that she is not due for her Pap smear until July of next year, we agreed to defer on physical until then.    C/o pain and cottage cheese dischg RT nipple.  Yesterday she felt some soreness of the right nipple throughout the day.  When she got home and squeezed the nipple, she noticed some white cottage cheese material came out of the nipple.  She has not seen anything since then but the nipple is still a little sore.  No redness.  Similar issue a little over a year ago for which we evaluated with a mammogram that came back okay. Fhx of breast CA in mom.  Mom did not want to be tested for gene.  Dx in her 97s   HTN:  taking Norvasc 10 mg daily as prescribed.  Got sick with cough 3 wks ago.  Had cough, hoarsness the 1st wk.  Voice started coming back.  Sometimes still get cough.  Patient Active Problem List   Diagnosis Date Noted   Prediabetes 10/24/2019   Non-seasonal allergic rhinitis 10/24/2019   Essential hypertension 06/27/2017   Benign mole 06/27/2017   History of severe pre-eclampsia 09/22/2016   Chronic hypertension with superimposed preeclampsia 06/28/2016     Current Outpatient Medications on File Prior to Visit  Medication Sig Dispense Refill   amLODipine (NORVASC) 10 MG tablet TAKE 1 TABLET (10 MG TOTAL) BY MOUTH DAILY. 90 tablet 3   No current facility-administered medications on file prior to visit.    Allergies   Allergen Reactions   Penicillins Hives    Has patient had a PCN reaction causing immediate rash, facial/tongue/throat swelling, SOB or lightheadedness with hypotension: Yes Has patient had a PCN reaction causing severe rash involving mucus membranes or skin necrosis: No Has patient had a PCN reaction that required hospitalization Yes Has patient had a PCN reaction occurring within the last 10 years: No If all of the above answers are "NO", then may proceed with Cephalosporin use.     Social History   Socioeconomic History   Marital status: Married    Spouse name: Not on file   Number of children: 5   Years of education: Not on file   Highest education level: 7th grade  Occupational History   Not on file  Tobacco Use   Smoking status: Never   Smokeless tobacco: Never  Substance and Sexual Activity   Alcohol use: No   Drug use: No   Sexual activity: Yes    Birth control/protection: Surgical  Other Topics Concern   Not on file  Social History Narrative   Not on file   Social Determinants of Health   Financial Resource Strain: Not on file  Food Insecurity: No Food Insecurity (11/16/2021)   Hunger Vital Sign    Worried About Running Out of Food in the Last Year: Never true    Ran Out of Food in  the Last Year: Never true  Transportation Needs: No Transportation Needs (11/16/2021)   PRAPARE - Administrator, Civil Service (Medical): No    Lack of Transportation (Non-Medical): No  Physical Activity: Not on file  Stress: Not on file  Social Connections: Not on file  Intimate Partner Violence: Not on file    Family History  Problem Relation Age of Onset   Diabetes Mother    Hypertension Mother    Breast cancer Mother    Hypertension Sister    Asthma Son    Diabetes Maternal Grandmother     Past Surgical History:  Procedure Laterality Date   CESAREAN SECTION N/A 10/23/2013   Procedure: Primary Cesarean Section Delivery Baby Girl @ 0004, Apgars 8/8;   Surgeon: Tilda Burrow, MD;  Location: [facility];  Service: Obstetrics;  Laterality: N/A;   TUBAL LIGATION Bilateral 01/02/2017   Procedure: POST PARTUM TUBAL LIGATION;  Surgeon: Adam Phenix, MD;  Location: [facility];  Service: Gynecology;  Laterality: Bilateral;    ROS: Review of Systems Negative except as stated above  PHYSICAL EXAM: BP (!) 140/80   Pulse 87   Temp 98.2 F (36.8 C) (Oral)   Ht 4\' 9"  (1.448 m)   Wt 164 lb (74.4 kg)   LMP 10/02/2023 (Exact Date)   SpO2 98%   BMI 35.49 kg/m   Physical Exam  General appearance - alert, well appearing, and in no distress Mental status - normal mood, behavior, speech, dress, motor activity, and thought processes Chest - clear to auscultation, no wheezes, rales or rhonchi, symmetric air entry Heart - normal rate, regular rhythm, normal S1, S2, no murmurs, rubs, clicks or gallops Breasts -no axillary lymphadenopathy.  No masses felt in either breast.  Tiny amount of white to clear liquid noted on squeezing the right nipple. Extremities - peripheral pulses normal, no pedal edema, no clubbing or cyanosis      Latest Ref Rng & Units 01/16/2023    9:43 AM 02/17/2021    8:57 AM 06/21/2019    2:41 PM  CMP  Glucose 70 - 99 mg/dL 88  91  81   BUN 6 - 20 mg/dL 8  13  11    Creatinine 0.57 - 1.00 mg/dL 9.14  7.82  9.56   Sodium 134 - 144 mmol/L 142  141  139   Potassium 3.5 - 5.2 mmol/L 4.4  4.6  4.2   Chloride 96 - 106 mmol/L 103  104  104   CO2 20 - 29 mmol/L 22  22  23    Calcium 8.7 - 10.2 mg/dL 9.6  9.2  9.2   Total Protein 6.0 - 8.5 g/dL 8.0  7.5    Total Bilirubin 0.0 - 1.2 mg/dL 0.3  0.3    Alkaline Phos 44 - 121 IU/L 93  84    AST 0 - 40 IU/L 21  16    ALT 0 - 32 IU/L 25  25     Lipid Panel     Component Value Date/Time   CHOL 227 (H) 01/16/2023 0943   TRIG 151 (H) 01/16/2023 0943   HDL 63 01/16/2023 0943   CHOLHDL 3.6 01/16/2023 0943   LDLCALC 137 (H) 01/16/2023 0943    CBC    Component Value Date/Time   WBC 7.6  01/16/2023 0943   WBC 8.6 03/06/2018 0914   RBC 4.53 01/16/2023 0943   RBC 4.62 03/06/2018 0914   HGB 13.6 01/16/2023 0943   HCT 41.3  01/16/2023 0943   PLT 366 01/16/2023 0943   MCV 91 01/16/2023 0943   MCH 30.0 01/16/2023 0943   MCH 30.5 03/06/2018 0914   MCHC 32.9 01/16/2023 0943   MCHC 33.7 03/06/2018 0914   RDW 13.1 01/16/2023 0943   LYMPHSABS 2.7 01/16/2023 0943   MONOABS 570 06/28/2016 1005   EOSABS 0.3 01/16/2023 0943   BASOSABS 0.1 01/16/2023 0943    ASSESSMENT AND PLAN: 1. Essential hypertension Not at goal.  We discussed adding another medication versus having her return for recheck in several weeks.  She prefers the latter.  She will try to go to [facility] intermittently before then to check blood pressure and record readings. - amLODipine (NORVASC) 10 MG tablet; Take 1 tablet (10 mg total) by mouth daily.  Dispense: 90 tablet; Refill: 3  2. Breast discharge - MM Digital Diagnostic Unilat R; Future  3. Breast pain, left - MM Digital Diagnostic Unilat R; Future  4.  Sounds as though she is on the tail end of an upper respiratory infection  Patient was given the opportunity to ask questions.  Patient verbalized understanding of the plan and was able to repeat key elements of the plan.   This documentation was completed using Paediatric nurse.  Any transcriptional errors are unintentional.  Orders Placed This Encounter  Procedures   MM Digital Diagnostic Unilat R     Requested Prescriptions   Signed Prescriptions Disp Refills   amLODipine (NORVASC) 10 MG tablet 90 tablet 3    Sig: Take 1 tablet (10 mg total) by mouth daily.    Return in about 4 months (around 02/10/2024) for Appt with [facility] in 4 wks for BP check.  Jonah Blue, MD, FACP

## 2023-10-16 ENCOUNTER — Other Ambulatory Visit: Payer: Self-pay

## 2023-10-16 ENCOUNTER — Other Ambulatory Visit: Payer: Self-pay | Admitting: Internal Medicine

## 2023-10-16 ENCOUNTER — Other Ambulatory Visit: Payer: Self-pay | Admitting: Hematology and Oncology

## 2023-10-16 DIAGNOSIS — N644 Mastodynia: Secondary | ICD-10-CM

## 2023-10-16 DIAGNOSIS — N6452 Nipple discharge: Secondary | ICD-10-CM

## 2023-10-16 DIAGNOSIS — I10 Essential (primary) hypertension: Secondary | ICD-10-CM

## 2023-10-16 DIAGNOSIS — J069 Acute upper respiratory infection, unspecified: Secondary | ICD-10-CM

## 2023-10-19 ENCOUNTER — Ambulatory Visit: Payer: Self-pay | Admitting: Hematology and Oncology

## 2023-10-19 VITALS — BP 132/79 | Wt 162.0 lb

## 2023-10-19 DIAGNOSIS — N6452 Nipple discharge: Secondary | ICD-10-CM

## 2023-10-19 NOTE — Progress Notes (Signed)
Ms. Abbygayle Helfand Blessings Inglett is a 37 y.o. female who presents to [facility] clinic today with complaint of right nipple lump with discharge that has since resolved.    Pap Smear: Pap not smear completed today. Last Pap smear was 06/21/2019 and was normal. Per patient has no history of an abnormal Pap smear. Last Pap smear result is available in Epic.   Physical exam: Breasts Breasts symmetrical. No skin abnormalities bilateral breasts. No nipple retraction bilateral breasts. No nipple discharge bilateral breasts. No lymphadenopathy. No lumps palpated bilateral breasts.    MS DIGITAL DIAG TOMO BILAT  Result Date: 11/16/2021 CLINICAL DATA:  Patient presents for evaluation of bilateral nipple discharge, green and whitish, predominately with expression. EXAM: DIGITAL DIAGNOSTIC BILATERAL MAMMOGRAM WITH TOMOSYNTHESIS AND CAD TECHNIQUE: Bilateral digital diagnostic mammography and breast tomosynthesis was performed. The images were evaluated with computer-aided detection. COMPARISON:  None. ACR Breast Density Category c: The breast tissue is heterogeneously dense, which may obscure small masses. FINDINGS: No concerning masses, calcifications or distortion identified within either breast. IMPRESSION: No mammographic evidence for malignancy. Likely physiologic nipple discharge given bilateral nature, color and predominately non spontaneous. RECOMMENDATION: Continued clinical evaluation for nipple discharge. Screening mammogram at age 26 I have discussed the findings and recommendations with the patient. If applicable, a reminder letter will be sent to the patient regarding the next appointment. BI-RADS CATEGORY  2: Benign. Electronically Signed   By: Annia Belt M.D.   On: 11/16/2021 15:22      Pelvic/Bimanual Pap is not indicated today    Smoking History: Patient has never smoked and was not referred to quit line.    Patient Navigation: Patient education provided. Access to services provided for patient through  [facility] program. No interpreter provided. No transportation provided   Colorectal Cancer Screening: Per patient has never had colonoscopy completed No complaints today.    Breast and Cervical Cancer Risk Assessment: Patient has family history of breast cancer, with her mother at age 74 yo. Patient does not have history of cervical dysplasia, immunocompromised, or DES exposure in-utero.  Risk Scores as of Encounter on 10/19/2023     Dondra Spry           5-year 0.74%   Lifetime 18.11%   This patient is [place] but has no documented birth country, so the [place] model used data from [place] patients to calculate their risk score. Document a birth country in the Demographics activity for a more accurate score.         Last calculated by Caprice Red, CMA on 10/19/2023 at  8:42 AM        A: [facility] exam without pap smear Complaint of right nipple lump that was examined by PCP and noted to have green, "cottage cheese" appearing discharge. Since examination, this has resolved.   P: Referred patient to the [facility] for a diagnostic mammogram. Appointment scheduled 10/24/2023.  Ilda Basset A, NP 10/19/2023 8:58 AM

## 2023-10-19 NOTE — Patient Instructions (Signed)
Taught Melissa Quinn about self breast awareness and gave educational materials to take home. Patient did not need a Pap smear today due to last Pap smear was in 06/21/2019 per patient. Let her know [facility] will cover Pap smears every 5 years unless has a history of abnormal Pap smears. Referred patient to the [facility] for diagnostic mammogram. Appointment scheduled for 10/24/2023. Patient aware of appointment and will be there. Let patient know will follow up with her within the next couple weeks with results. Melissa Quinn verbalized understanding.  Pascal Lux, NP 9:03 AM

## 2023-10-23 ENCOUNTER — Other Ambulatory Visit: Payer: Self-pay

## 2023-10-24 ENCOUNTER — Ambulatory Visit
Admission: RE | Admit: 2023-10-24 | Discharge: 2023-10-24 | Disposition: A | Discharge status: Home / Self Care | Payer: Self-pay | Source: Ambulatory Visit | Attending: Hematology and Oncology | Admitting: Hematology and Oncology

## 2023-10-24 ENCOUNTER — Ambulatory Visit
Admission: RE | Admit: 2023-10-24 | Discharge: 2023-10-24 | Disposition: A | Discharge status: Home / Self Care | Payer: No Typology Code available for payment source | Source: Ambulatory Visit | Attending: Internal Medicine | Admitting: Internal Medicine

## 2023-10-24 DIAGNOSIS — N6452 Nipple discharge: Secondary | ICD-10-CM

## 2023-10-24 DIAGNOSIS — N644 Mastodynia: Secondary | ICD-10-CM

## 2023-11-14 ENCOUNTER — Ambulatory Visit: Payer: Self-pay | Admitting: Pharmacist

## 2024-02-16 ENCOUNTER — Ambulatory Visit: Payer: Self-pay | Admitting: Internal Medicine

## 2024-02-29 ENCOUNTER — Other Ambulatory Visit: Payer: Self-pay

## 2024-03-05 ENCOUNTER — Other Ambulatory Visit: Payer: Self-pay

## 2024-04-12 ENCOUNTER — Encounter: Payer: Self-pay | Admitting: Internal Medicine

## 2024-04-12 ENCOUNTER — Ambulatory Visit: Payer: Self-pay | Attending: Internal Medicine | Admitting: Internal Medicine

## 2024-04-12 ENCOUNTER — Other Ambulatory Visit: Payer: Self-pay

## 2024-04-12 VITALS — BP 123/77 | HR 71 | Temp 98.2°F | Ht <= 58 in | Wt 167.0 lb

## 2024-04-12 DIAGNOSIS — R7303 Prediabetes: Secondary | ICD-10-CM

## 2024-04-12 DIAGNOSIS — I10 Essential (primary) hypertension: Secondary | ICD-10-CM

## 2024-04-12 DIAGNOSIS — Z6836 Body mass index (BMI) 36.0-36.9, adult: Secondary | ICD-10-CM

## 2024-04-12 DIAGNOSIS — E66812 Obesity, class 2: Secondary | ICD-10-CM

## 2024-04-12 LAB — POCT GLYCOSYLATED HEMOGLOBIN (HGB A1C): HbA1c, POC (prediabetic range): 6.2 % (ref 5.7–6.4)

## 2024-04-12 LAB — GLUCOSE, POCT (MANUAL RESULT ENTRY): POC Glucose: 89 mg/dL (ref 70–99)

## 2024-04-12 MED ORDER — AMLODIPINE BESYLATE 10 MG PO TABS
10.0000 mg | ORAL_TABLET | Freq: Every day | ORAL | 3 refills | Status: AC
  Filled 2024-04-12 – 2024-05-30 (×2): qty 90, 90d supply, fill #0
  Filled 2024-08-20: qty 90, 90d supply, fill #1
  Filled 2024-12-01 – 2024-12-17 (×2): qty 90, 90d supply, fill #2

## 2024-04-12 NOTE — Progress Notes (Signed)
 Patient ID: Melissa Quinn, female    DOB: 1986/06/10  MRN: [id number]  CC: Hypertension (HTN & pre-diabetes f/u. Med refill. /No questions / concerns)   Subjective: Melissa Quinn is a 38 y.o. female who presents for chronic ds management. Her concerns today include:  Patient with history of HTN, preDM, HL.   Discussed the use of AI scribe software for clinical note transcription with the patient, who gave verbal consent to proceed.  History of Present Illness   Melissa Quinn is a 38 year old female with hypertension and prediabetes who presents for a follow-up visit.  Melissa Quinn  10 mg daily for hypertension, with current blood pressure at 123/77 mmHg. Melissa does not monitor blood pressure at home due to lack of a device. No chest pain, shortness of breath, or leg swelling.  Her prediabetes is managed with lifestyle modifications. Current A1c is 6.2%, and blood sugar level is 89 mg/dL. Melissa is making dietary changes, including not eating past 7 PM and incorporating more healthy foods. Melissa is cooking more at home, including fresh fruits and vegetables, and reducing pork and red meat intake, favoring chicken and fish.  Melissa engages in physical activity by walking for 30 minutes to an hour at the park with her daughters at least three times a week.       Patient Active Problem List   Diagnosis Date Noted   Prediabetes 10/24/2019   Non-seasonal allergic rhinitis 10/24/2019   Essential hypertension 06/27/2017   Benign mole 06/27/2017   History of severe pre-eclampsia 09/22/2016   Chronic hypertension with superimposed preeclampsia 06/28/2016     Current Outpatient Medications on File Prior to Visit  Medication Sig Dispense Refill   Quinn  (NORVASC ) 10 MG tablet Take 1 tablet (10 mg total) by mouth daily. 90 tablet 3   No current facility-administered medications on file prior to visit.    Allergies  Allergen Reactions   Penicillins Hives    Has  patient had a PCN reaction causing immediate rash, facial/tongue/throat swelling, SOB or lightheadedness with hypotension: Yes Has patient had a PCN reaction causing severe rash involving mucus membranes or skin necrosis: No Has patient had a PCN reaction that required hospitalization Yes Has patient had a PCN reaction occurring within the last 10 years: No If all of the above answers are "NO", then may proceed with Cephalosporin use.     Social History   Socioeconomic History   Marital status: Married    Spouse name: Not on file   Number of children: 5   Years of education: Not on file   Highest education level: 7th grade  Occupational History   Not on file  Tobacco Use   Smoking status: Never   Smokeless tobacco: Never  Vaping Use   Vaping status: Never Used  Substance and Sexual Activity   Alcohol use: Never   Drug use: Never   Sexual activity: Yes    Birth control/protection: Surgical    Comment: Tubal ligation  Other Topics Concern   Not on file  Social History Narrative   Not on file   Social Drivers of Health   Financial Resource Strain: Low Risk  (04/12/2024)   Overall Financial Resource Strain (CARDIA)    Difficulty of Paying Living Expenses: Not very hard  Food Insecurity: No Food Insecurity (04/12/2024)   Hunger Vital Sign    Worried About Running Out of Food in the Last Year: Never true    Ran  Out of Food in the Last Year: Never true  Transportation Needs: No Transportation Needs (04/12/2024)   PRAPARE - Administrator, Civil Service (Medical): No    Lack of Transportation (Non-Medical): No  Physical Activity: Inactive (04/12/2024)   Exercise Vital Sign    Days of Exercise per Week: 0 days    Minutes of Exercise per Session: 0 min  Stress: No Stress Concern Present (04/12/2024)   [employer] of Occupational Health - Occupational Stress Questionnaire    Feeling of Stress : Not at all  Social Connections: Moderately Integrated (04/12/2024)    Social Connection and Isolation Panel [NHANES]    Frequency of Communication with Friends and Family: Once a week    Frequency of Social Gatherings with Friends and Family: Once a week    Attends Religious Services: 1 to 4 times per year    Active Member of [employer] or Organizations: Yes    Attends Banker Meetings: 1 to 4 times per year    Marital Status: Married  Catering manager Violence: Not At Risk (04/12/2024)   Humiliation, Afraid, Rape, and Kick questionnaire    Fear of Current or Ex-Partner: No    Emotionally Abused: No    Physically Abused: No    Sexually Abused: No    Family History  Problem Relation Age of Onset   Diabetes Mother    Hypertension Mother    Breast cancer Mother 28   Hypertension Sister    Asthma Son    Diabetes Maternal Grandmother     Past Surgical History:  Procedure Laterality Date   CESAREAN SECTION N/A 10/23/2013   Procedure: Primary Cesarean Section Delivery Baby Girl @ 0004, Apgars 8/8;  Surgeon: Albino Hum, MD;  Location: [facility];  Service: Obstetrics;  Laterality: N/A;   TUBAL LIGATION Bilateral 01/02/2017   Procedure: POST PARTUM TUBAL LIGATION;  Surgeon: Tresia Fruit, MD;  Location: [facility];  Service: Gynecology;  Laterality: Bilateral;    ROS: Review of Systems Negative except as stated above  PHYSICAL EXAM: BP 123/77 (BP Location: Left Arm, Patient Position: Sitting, Cuff Size: Normal)   Pulse 71   Temp 98.2 F (36.8 C) (Oral)   Ht 4\' 9"  (1.448 m)   Wt 167 lb (75.8 kg)   SpO2 99%   BMI 36.14 kg/m   Wt Readings from Last 3 Encounters:  04/12/24 167 lb (75.8 kg)  10/19/23 162 lb (73.5 kg)  10/13/23 164 lb (74.4 kg)    Physical Exam  General appearance - alert, well appearing, and in no distress Mental status - normal mood, behavior, speech, dress, motor activity, and thought processes Neck - supple, no significant adenopathy Chest - clear to auscultation, no wheezes, rales or rhonchi, symmetric air  entry Heart - normal rate, regular rhythm, normal S1, S2, no murmurs, rubs, clicks or gallops Extremities - peripheral pulses normal, no pedal edema, no clubbing or cyanosis      Latest Ref Rng & Units 01/16/2023    9:43 AM 02/17/2021    8:57 AM 06/21/2019    2:41 PM  CMP  Glucose 70 - 99 mg/dL 88  91  81   BUN 6 - 20 mg/dL 8  13  11    Creatinine 0.57 - 1.00 mg/dL 1.19  1.47  8.29   Sodium 134 - 144 mmol/L 142  141  139   Potassium 3.5 - 5.2 mmol/L 4.4  4.6  4.2   Chloride 96 - 106 mmol/L 103  104  104   CO2 20 - 29 mmol/L 22  22  23    Calcium  8.7 - 10.2 mg/dL 9.6  9.2  9.2   Total Protein 6.0 - 8.5 g/dL 8.0  7.5    Total Bilirubin 0.0 - 1.2 mg/dL 0.3  0.3    Alkaline Phos 44 - 121 IU/L 93  84    AST 0 - 40 IU/L 21  16    ALT 0 - 32 IU/L 25  25     Lipid Panel     Component Value Date/Time   CHOL 227 (H) 01/16/2023 0943   TRIG 151 (H) 01/16/2023 0943   HDL 63 01/16/2023 0943   CHOLHDL 3.6 01/16/2023 0943   LDLCALC 137 (H) 01/16/2023 0943    CBC    Component Value Date/Time   WBC 7.6 01/16/2023 0943   WBC 8.6 03/06/2018 0914   RBC 4.53 01/16/2023 0943   RBC 4.62 03/06/2018 0914   HGB 13.6 01/16/2023 0943   HCT 41.3 01/16/2023 0943   PLT 366 01/16/2023 0943   MCV 91 01/16/2023 0943   MCH 30.0 01/16/2023 0943   MCH 30.5 03/06/2018 0914   MCHC 32.9 01/16/2023 0943   MCHC 33.7 03/06/2018 0914   RDW 13.1 01/16/2023 0943   LYMPHSABS 2.7 01/16/2023 0943   MONOABS 570 06/28/2016 1005   EOSABS 0.3 01/16/2023 0943   BASOSABS 0.1 01/16/2023 0943    ASSESSMENT AND PLAN: 1. Essential hypertension (Primary) At goal.  Continue Quinn  10 mg daily. - Quinn  (NORVASC ) 10 MG tablet; Take 1 tablet (10 mg total) by mouth daily.  Dispense: 90 tablet; Refill: 3  2. Prediabetes Encouraged her to continue healthy eating habits and regular exercise. - POCT glycosylated hemoglobin (Hb A1C) - POCT glucose (manual entry)  3. Class 2 severe obesity due to excess calories with  serious comorbidity and body mass index (BMI) of 36.0 to 36.9 in adult [facility]) See #2 above.    Patient was given the opportunity to ask questions.  Patient verbalized understanding of the plan and was able to repeat key elements of the plan.   This documentation was completed using Paediatric nurse.  Any transcriptional errors are unintentional.  Orders Placed This Encounter  Procedures   POCT glycosylated hemoglobin (Hb A1C)   POCT glucose (manual entry)     Requested Prescriptions   Pending Prescriptions Disp Refills   Quinn  (NORVASC ) 10 MG tablet 90 tablet 3    Sig: Take 1 tablet (10 mg total) by mouth daily.    No follow-ups on file.  Concetta Dee, MD, FACP

## 2024-04-12 NOTE — Patient Instructions (Signed)
 VISIT SUMMARY:  Sibella Lawe, you had a follow-up visit today to check on your hypertension and prediabetes. Your blood pressure is well-controlled, and your blood sugar levels are within a normal range. You have made significant improvements in your diet and exercise habits.  YOUR PLAN:  -HYPERTENSION: Hypertension means high blood pressure. Your blood pressure is well-controlled with your current medication, amlodipine  10 mg daily. Please continue taking this medication as prescribed. Your prescription has been refilled.  -PREDIABETES: Prediabetes means your blood sugar levels are higher than normal but not high enough to be classified as diabetes. Your current A1c is 6.2%, and your blood sugar level is 89 mg/dL, which are good. Continue with your dietary changes and regular physical activity to manage this condition.  INSTRUCTIONS:  Please continue with your current medications and lifestyle modifications. No additional follow-up is needed at this time, but please schedule your next routine check-up in 3 months.

## 2024-05-30 ENCOUNTER — Other Ambulatory Visit: Payer: Self-pay

## 2024-06-06 ENCOUNTER — Other Ambulatory Visit: Payer: Self-pay

## 2024-08-14 ENCOUNTER — Telehealth: Payer: Self-pay | Admitting: Internal Medicine

## 2024-08-14 NOTE — Telephone Encounter (Signed)
 Patient confirmed appointment for 08/15/2024, through volunteer call.

## 2024-08-15 ENCOUNTER — Other Ambulatory Visit (HOSPITAL_COMMUNITY)
Admission: RE | Admit: 2024-08-15 | Discharge: 2024-08-15 | Disposition: A | Discharge status: Home / Self Care | Payer: Self-pay | Source: Ambulatory Visit | Attending: Internal Medicine | Admitting: Internal Medicine

## 2024-08-15 ENCOUNTER — Ambulatory Visit: Payer: Self-pay | Attending: Internal Medicine | Admitting: Internal Medicine

## 2024-08-15 ENCOUNTER — Other Ambulatory Visit: Payer: Self-pay

## 2024-08-15 ENCOUNTER — Encounter: Payer: Self-pay | Admitting: Internal Medicine

## 2024-08-15 VITALS — BP 130/78 | HR 75 | Ht <= 58 in | Wt 166.6 lb

## 2024-08-15 DIAGNOSIS — Z0000 Encounter for general adult medical examination without abnormal findings: Secondary | ICD-10-CM

## 2024-08-15 DIAGNOSIS — Z23 Encounter for immunization: Secondary | ICD-10-CM

## 2024-08-15 DIAGNOSIS — Z124 Encounter for screening for malignant neoplasm of cervix: Secondary | ICD-10-CM | POA: Insufficient documentation

## 2024-08-15 DIAGNOSIS — E66812 Obesity, class 2: Secondary | ICD-10-CM

## 2024-08-15 DIAGNOSIS — Z1159 Encounter for screening for other viral diseases: Secondary | ICD-10-CM

## 2024-08-15 DIAGNOSIS — K5900 Constipation, unspecified: Secondary | ICD-10-CM

## 2024-08-15 DIAGNOSIS — I10 Essential (primary) hypertension: Secondary | ICD-10-CM

## 2024-08-15 DIAGNOSIS — Z6836 Body mass index (BMI) 36.0-36.9, adult: Secondary | ICD-10-CM

## 2024-08-15 NOTE — Progress Notes (Signed)
 Patient ID: Melissa Quinn, female    DOB: 06/05/1986  MRN: [id number]  CC: Physical  Subjective: Melissa Quinn is a 38 y.o. female who presents for physical Her concerns today include:  Patient with history of HTN, preDM, HL.   Discussed the use of AI scribe software for clinical note transcription with the patient, who gave verbal consent to proceed.  History of Present Illness Melissa Quinn is a 38 year old female who presents for an annual physical exam including a Pap smear.  GYN History:  Pt is G5P5 Any hx of abn paps?: yes, bx in 2012 Menses regular or irregular?: regular How long does menses last? 5-6 days Menstrual flow light or heavy?: moderate Method of birth control?:  tubal ligation Any vaginal dischg at this time?: no Dysuria?: no Any hx of STI?: no Sexually active with how many partners: spouse only Desires STI screen: yes Last MMG: 10/2023 Family hx of uterine, cervical or breast cancer?:  breast cancer in mom at age 12  HTN: She is currently taking amlodipine  10 mg daily for hypertension and took already for today. She does not regularly check her blood pressure at home and does not own a device for it. She limits salt intake in her diet and has no chest pain or shortness of breath.  Obesity: She reports improved eating habits, cooking more at home, and consuming more fruits and vegetables while reducing oil intake. She engages in physical activity, walking three times a week for about an hour and hiking once a week with her family.  She experiences occasional constipation, particularly around her menstrual cycle, which she manages with green juices and increased water intake. She drinks a lot of water at work and consumes green leafy vegetables and fresh fruits to help with regularity.  No hearing problems, chronic cough, or chest pain. She reports having a mole in the medial corner of her right eye for yrs, which her eye doctor has seen and  told her is not growing. She had an eye exam two months ago and wears glasses.  She does not smoke, drink alcohol, or use street drugs.  HM: will get flu shot for free through job  Patient Active Problem List   Diagnosis Date Noted   Prediabetes 10/24/2019   Non-seasonal allergic rhinitis 10/24/2019   Essential hypertension 06/27/2017   Benign mole 06/27/2017   History of severe pre-eclampsia 09/22/2016   Chronic hypertension with superimposed preeclampsia 06/28/2016     Current Outpatient Medications on File Prior to Visit  Medication Sig Dispense Refill   amLODipine  (NORVASC ) 10 MG tablet Take 1 tablet (10 mg total) by mouth daily. 90 tablet 3   No current facility-administered medications on file prior to visit.    Allergies  Allergen Reactions   Penicillins Hives    Has patient had a PCN reaction causing immediate rash, facial/tongue/throat swelling, SOB or lightheadedness with hypotension: Yes Has patient had a PCN reaction causing severe rash involving mucus membranes or skin necrosis: No Has patient had a PCN reaction that required hospitalization Yes Has patient had a PCN reaction occurring within the last 10 years: No If all of the above answers are NO, then may proceed with Cephalosporin use.     Social History   Socioeconomic History   Marital status: Married    Spouse name: Not on file   Number of children: 5   Years of education: Not on file   Highest education  level: 7th grade  Occupational History   Not on file  Tobacco Use   Smoking status: Never   Smokeless tobacco: Never  Vaping Use   Vaping status: Never Used  Substance and Sexual Activity   Alcohol use: Never   Drug use: Never   Sexual activity: Yes    Birth control/protection: Surgical    Comment: Tubal ligation  Other Topics Concern   Not on file  Social History Narrative   Not on file   Social Drivers of Health   Financial Resource Strain: Low Risk  (04/12/2024)   Overall Financial  Resource Strain (CARDIA)    Difficulty of Paying Living Expenses: Not very hard  Food Insecurity: No Food Insecurity (04/12/2024)   Hunger Vital Sign    Worried About Running Out of Food in the Last Year: Never true    Ran Out of Food in the Last Year: Never true  Transportation Needs: No Transportation Needs (04/12/2024)   PRAPARE - Administrator, Civil Service (Medical): No    Lack of Transportation (Non-Medical): No  Physical Activity: Inactive (04/12/2024)   Exercise Vital Sign    Days of Exercise per Week: 0 days    Minutes of Exercise per Session: 0 min  Stress: No Stress Concern Present (04/12/2024)   [employer] of Occupational Health - Occupational Stress Questionnaire    Feeling of Stress : Not at all  Social Connections: Moderately Integrated (04/12/2024)   Social Connection and Isolation Panel    Frequency of Communication with Friends and Family: Once a week    Frequency of Social Gatherings with Friends and Family: Once a week    Attends Religious Services: 1 to 4 times per year    Active Member of [employer] or Organizations: Yes    Attends Banker Meetings: 1 to 4 times per year    Marital Status: Married  Catering manager Violence: Not At Risk (04/12/2024)   Humiliation, Afraid, Rape, and Kick questionnaire    Fear of Current or Ex-Partner: No    Emotionally Abused: No    Physically Abused: No    Sexually Abused: No    Family History  Problem Relation Age of Onset   Diabetes Mother    Hypertension Mother    Breast cancer Mother 42   Hypertension Sister    Asthma Son    Diabetes Maternal Grandmother     Past Surgical History:  Procedure Laterality Date   CESAREAN SECTION N/A 10/23/2013   Procedure: Primary Cesarean Section Delivery Baby Girl @ 0004, Apgars 8/8;  Surgeon: Norleen LULLA Server, MD;  Location: [facility];  Service: Obstetrics;  Laterality: N/A;   TUBAL LIGATION Bilateral 01/02/2017   Procedure: POST PARTUM TUBAL LIGATION;  Surgeon:  Lynwood KANDICE Solomons, MD;  Location: [facility];  Service: Gynecology;  Laterality: Bilateral;    ROS: Review of Systems  HENT:  Negative for hearing loss, sore throat and trouble swallowing.   Respiratory:  Negative for shortness of breath.   Cardiovascular:  Negative for chest pain.  Gastrointestinal:  Negative for abdominal pain.  Musculoskeletal:  Negative for arthralgias.   PHYSICAL EXAM: BP 130/78   Pulse 75   Ht 4' 9 (1.448 m)   Wt 166 lb 9.6 oz (75.6 kg)   SpO2 98%   BMI 36.05 kg/m   Wt Readings from Last 3 Encounters:  08/15/24 166 lb 9.6 oz (75.6 kg)  04/12/24 167 lb (75.8 kg)  10/19/23 162 lb (73.5 kg)  Physical Exam Constitutional: Appears well-developed and well-nourished. No distress. Head: Normocephalic. Atraumatic Ears: small amount of wax build up at opening to Rt ear canal. LT side normal Eyes: Conjunctivae and EOM are normal. PERRLA, no scleral icterus. Small round hyperpigmented mole of medial canthus RT eye Mouth: no oral lesions, good oral hygiene, throat clear without exudates Neck: Neck supple.  No tracheal deviation. No thyromegaly. No cervical LN CVS: RRR, S1/S2 +, no murmurs, no gallops, no carotid bruit. No JVD Pulmonary: Effort and breath sounds normal, no stridor, rhonchi, wheezes, rales.  Abdominal: Soft. BS +,  no distension, tenderness, rebound or guarding. 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 Cervix grossly normal, no CMT or adnexal masses. Uterus normal size. Musculoskeletal: Normal range of motion. No edema and no tenderness.  Neuro: Alert and oriented x3.  Cns grossly intact, Power: 5/5 BL in all 4s.   Normal  muscle tone, coordination. . Skin: Skin is warm and dry. No rash noted. Not diaphoretic. No erythema. No pallor.  Psychiatric: Normal mood and affect. Behavior, judgment, thought content normal.        Latest Ref Rng & Units 01/16/2023    9:43 AM 02/17/2021    8:57 AM  06/21/2019    2:41 PM  CMP  Glucose 70 - 99 mg/dL 88  91  81   BUN 6 - 20 mg/dL 8  13  11    Creatinine 0.57 - 1.00 mg/dL 9.28  9.42  9.33   Sodium 134 - 144 mmol/L 142  141  139   Potassium 3.5 - 5.2 mmol/L 4.4  4.6  4.2   Chloride 96 - 106 mmol/L 103  104  104   CO2 20 - 29 mmol/L 22  22  23    Calcium  8.7 - 10.2 mg/dL 9.6  9.2  9.2   Total Protein 6.0 - 8.5 g/dL 8.0  7.5    Total Bilirubin 0.0 - 1.2 mg/dL 0.3  0.3    Alkaline Phos 44 - 121 IU/L 93  84    AST 0 - 40 IU/L 21  16    ALT 0 - 32 IU/L 25  25     Lipid Panel     Component Value Date/Time   CHOL 227 (H) 01/16/2023 0943   TRIG 151 (H) 01/16/2023 0943   HDL 63 01/16/2023 0943   CHOLHDL 3.6 01/16/2023 0943   LDLCALC 137 (H) 01/16/2023 0943    CBC    Component Value Date/Time   WBC 7.6 01/16/2023 0943   WBC 8.6 03/06/2018 0914   RBC 4.53 01/16/2023 0943   RBC 4.62 03/06/2018 0914   HGB 13.6 01/16/2023 0943   HCT 41.3 01/16/2023 0943   PLT 366 01/16/2023 0943   MCV 91 01/16/2023 0943   MCH 30.0 01/16/2023 0943   MCH 30.5 03/06/2018 0914   MCHC 32.9 01/16/2023 0943   MCHC 33.7 03/06/2018 0914   RDW 13.1 01/16/2023 0943   LYMPHSABS 2.7 01/16/2023 0943   MONOABS 570 06/28/2016 1005   EOSABS 0.3 01/16/2023 0943   BASOSABS 0.1 01/16/2023 0943    ASSESSMENT AND PLAN: 1. Annual physical exam (Primary) Commended her on dietary changes and encouraged her to keep up the good works.  Continue regular aerobic exercise.  She is exceeding the goal of 150 minutes/week.  Advised routine dental cleaning at least twice a year.  Continue to see ophthalmologist at least every 2 years. - CBC; Future - Comprehensive  metabolic panel with GFR; Future  2. Pap smear for cervical cancer screening - Cytology - PAP([place]) - Cervicovaginal ancillary only  3. Essential hypertension At goal. Continue Norvasc  10 mg daily and DASH  4. Class 2 severe obesity due to excess calories with serious comorbidity and body mass index  (BMI) of 36.0 to 36.9 in adult [facility]) See #1 above - Comprehensive metabolic panel with GFR; Future - Lipid panel; Future  5. Constipation, unspecified constipation type Continue to incorporate fiber in the diet daily in the form of green leafy vegetables.  Can use MiraLAX over-the-counter when needed.  6. Need for hepatitis C screening test - Hepatitis C Antibody; Future  7. Need for HPV vaccination Patient thinks she did not get HPV vaccine when she was younger.  Advised that she can get the vaccine series through the health department.   Patient was given the opportunity to ask questions.  Patient verbalized understanding of the plan and was able to repeat key elements of the plan.   This documentation was completed using Paediatric nurse.  Any transcriptional errors are unintentional.  Orders Placed This Encounter  Procedures   CBC   Comprehensive metabolic panel with GFR   Lipid panel   Hepatitis C Antibody     Requested Prescriptions    No prescriptions requested or ordered in this encounter    Return in about 5 months (around 01/15/2025).  Barnie Louder, MD, FACP

## 2024-08-15 NOTE — Patient Instructions (Signed)

## 2024-08-16 LAB — CERVICOVAGINAL ANCILLARY ONLY
Chlamydia: NEGATIVE
Comment: NEGATIVE
Comment: NEGATIVE
Comment: NORMAL
Neisseria Gonorrhea: NEGATIVE
Trichomonas: NEGATIVE

## 2024-08-17 ENCOUNTER — Ambulatory Visit: Payer: Self-pay | Admitting: Internal Medicine

## 2024-08-19 LAB — CYTOLOGY - PAP
Adequacy: ABSENT
Comment: NEGATIVE
Diagnosis: NEGATIVE
High risk HPV: NEGATIVE

## 2024-08-20 ENCOUNTER — Other Ambulatory Visit: Payer: Self-pay

## 2024-08-20 ENCOUNTER — Ambulatory Visit: Payer: Self-pay | Attending: Family Medicine

## 2024-08-20 DIAGNOSIS — Z0000 Encounter for general adult medical examination without abnormal findings: Secondary | ICD-10-CM

## 2024-08-20 DIAGNOSIS — Z1159 Encounter for screening for other viral diseases: Secondary | ICD-10-CM

## 2024-08-20 DIAGNOSIS — E66812 Obesity, class 2: Secondary | ICD-10-CM

## 2024-08-21 LAB — CBC
Hematocrit: 42.4 % (ref 34.0–46.6)
Hemoglobin: 13.8 g/dL (ref 11.1–15.9)
MCH: 30.6 pg (ref 26.6–33.0)
MCHC: 32.5 g/dL (ref 31.5–35.7)
MCV: 94 fL (ref 79–97)
Platelets: 342 x10E3/uL (ref 150–450)
RBC: 4.51 x10E6/uL (ref 3.77–5.28)
RDW: 13.2 % (ref 11.7–15.4)
WBC: 8.4 x10E3/uL (ref 3.4–10.8)

## 2024-08-21 LAB — LIPID PANEL
Chol/HDL Ratio: 3.4 ratio (ref 0.0–4.4)
Cholesterol, Total: 217 mg/dL — ABNORMAL HIGH (ref 100–199)
HDL: 63 mg/dL (ref >39)
LDL Chol Calc (NIH): 124 mg/dL — ABNORMAL HIGH (ref 0–99)
Triglycerides: 169 mg/dL — ABNORMAL HIGH (ref 0–149)
VLDL Cholesterol Cal: 30 mg/dL (ref 5–40)

## 2024-08-21 LAB — COMPREHENSIVE METABOLIC PANEL WITH GFR
ALT: 41 IU/L — ABNORMAL HIGH (ref 0–32)
AST: 30 IU/L (ref 0–40)
Albumin: 4.5 g/dL (ref 3.9–4.9)
Alkaline Phosphatase: 97 IU/L (ref 44–121)
BUN/Creatinine Ratio: 15 (ref 9–23)
BUN: 10 mg/dL (ref 6–20)
Bilirubin Total: 0.4 mg/dL (ref 0.0–1.2)
CO2: 22 mmol/L (ref 20–29)
Calcium: 9.5 mg/dL (ref 8.7–10.2)
Chloride: 101 mmol/L (ref 96–106)
Creatinine, Ser: 0.65 mg/dL (ref 0.57–1.00)
Globulin, Total: 2.9 g/dL (ref 1.5–4.5)
Glucose: 92 mg/dL (ref 70–99)
Potassium: 3.9 mmol/L (ref 3.5–5.2)
Sodium: 139 mmol/L (ref 134–144)
Total Protein: 7.4 g/dL (ref 6.0–8.5)
eGFR: 116 mL/min/1.73 (ref >59)

## 2024-08-21 LAB — HEPATITIS C ANTIBODY: Hep C Virus Ab: NONREACTIVE

## 2024-12-02 ENCOUNTER — Other Ambulatory Visit: Payer: Self-pay

## 2024-12-13 ENCOUNTER — Other Ambulatory Visit: Payer: Self-pay

## 2024-12-17 ENCOUNTER — Other Ambulatory Visit: Payer: Self-pay

## 2025-01-16 ENCOUNTER — Ambulatory Visit: Payer: Self-pay | Admitting: Internal Medicine

## 2025-02-17 ENCOUNTER — Ambulatory Visit: Payer: Self-pay | Admitting: Internal Medicine
